# Patient Record
Sex: Male | Born: 1990
Health system: Southern US, Community
[De-identification: ages and names within clinical notes are randomized; demographics above are authoritative.]

---

## 2015-12-26 ENCOUNTER — Emergency Department (HOSPITAL_BASED_OUTPATIENT_CLINIC_OR_DEPARTMENT_OTHER)
Admission: EM | Admit: 2015-12-26 | Discharge: 2015-12-26 | Disposition: A | Discharge status: Home / Self Care | Payer: BLUE CROSS/BLUE SHIELD | Attending: Emergency Medicine | Admitting: Emergency Medicine

## 2015-12-26 ENCOUNTER — Encounter (HOSPITAL_BASED_OUTPATIENT_CLINIC_OR_DEPARTMENT_OTHER): Payer: Self-pay

## 2015-12-26 DIAGNOSIS — R51 Headache: Secondary | ICD-10-CM | POA: Insufficient documentation

## 2015-12-26 DIAGNOSIS — A084 Viral intestinal infection, unspecified: Secondary | ICD-10-CM | POA: Diagnosis not present

## 2015-12-26 DIAGNOSIS — M549 Dorsalgia, unspecified: Secondary | ICD-10-CM | POA: Insufficient documentation

## 2015-12-26 DIAGNOSIS — R112 Nausea with vomiting, unspecified: Secondary | ICD-10-CM | POA: Diagnosis present

## 2015-12-26 MED ORDER — SODIUM CHLORIDE 0.9 % IV BOLUS (SEPSIS)
1000.0000 mL | Freq: Once | INTRAVENOUS | Status: AC
  Administered 2015-12-26: 1000 mL via INTRAVENOUS

## 2015-12-26 MED ORDER — ONDANSETRON HCL 4 MG/2ML IJ SOLN
4.0000 mg | Freq: Once | INTRAMUSCULAR | Status: AC
  Administered 2015-12-26: 4 mg via INTRAVENOUS
  Filled 2015-12-26: qty 2

## 2015-12-26 MED ORDER — ONDANSETRON 8 MG PO TBDP: 8.0000 mg | ORAL_TABLET | Freq: Three times a day (TID) | ORAL | Status: DC | PRN

## 2015-12-26 MED ORDER — LOPERAMIDE HCL 2 MG PO CAPS
4.0000 mg | ORAL_CAPSULE | Freq: Once | ORAL | Status: AC
  Administered 2015-12-26: 4 mg via ORAL
  Filled 2015-12-26: qty 2

## 2015-12-26 MED FILL — ONDANSETRON ODT 8 MG TABLET: 8 | 4 days supply | Qty: 10 | Fill #0

## 2015-12-26 NOTE — Discharge Instructions (Signed)

## 2015-12-26 NOTE — ED Notes (Signed)
Pt c/o n/v/d, body aches since 0200 this morning.  He is able to hold down fluids for 30 minutes to an hour after vomiting.  No fevers, has not tried any OTC meds for symptoms.

## 2015-12-26 NOTE — ED Notes (Signed)
Pt tolerating PO fluids

## 2015-12-26 NOTE — ED Notes (Signed)
Pt c/o n/v/d and generalized body aches since 0200 this morning.  States he has had about 5 episodes of vomiting and has been able to hold down fluids in between episodes.

## 2015-12-26 NOTE — ED Provider Notes (Signed)
CSN: [id number]     Arrival date & time 12/26/15  [id number] History   First MD Initiated Contact with Patient 12/26/15 0622     Chief Complaint  Patient presents with  . Vomiting     (Consider location/radiation/quality/duration/timing/severity/associated sxs/prior Treatment) HPI  This is a 25 year old male with nausea, vomiting and diarrhea since about 2 this morning. He estimates he has vomited 6 times. His stools are described as watery. He is having some back pain and headache with this. He is also having some abdominal pain as a result of vomiting. Eyes a fever but complains of feeling cold. He has been able to keep some fluids down between episodes of vomiting.  History reviewed. No pertinent past medical history. History reviewed. No pertinent past surgical history. No family history on file. Social History  Substance Use Topics  . Smoking status: None  . Smokeless tobacco: None  . Alcohol Use: None    Review of Systems  All other systems reviewed and are negative.   Allergies  Review of patient's allergies indicates no known allergies.  Home Medications   Prior to Admission medications   Not on File   BP 109/68 mmHg  Pulse 80  Temp(Src) 98.3 F (36.8 C) (Oral)  Resp 18  Ht 5\' 10"  (1.778 m)  Wt 133 lb (60.328 kg)  BMI 19.08 kg/m2  SpO2 99%   Physical Exam  General: Well-developed, well-nourished male in no acute distress; appearance consistent with age of record HENT: normocephalic; atraumatic Eyes: pupils equal, round and reactive to light; extraocular muscles intact Neck: supple Heart: regular rate and rhythm; Lungs: clear to auscultation bilaterally Abdomen: soft; nondistended; epigastric tenderness; no masses or hepatosplenomegaly; bowel sounds present Extremities: No deformity; full range of motion; pulses normal Neurologic: Awake, alert and oriented; motor function intact in all extremities and symmetric; no facial droop Skin: Warm and dry Psychiatric:  Flat affect    ED Course  Procedures (including critical care time)   MDM  7:09 AM Patient has tolerated fluids without emesis after Zofran IV.      Paula LibraJohn Gearold Wainer, MD 12/26/15 [phone]

## 2016-02-26 ENCOUNTER — Encounter (HOSPITAL_COMMUNITY): Payer: Self-pay | Admitting: *Deleted

## 2016-02-26 ENCOUNTER — Emergency Department (HOSPITAL_COMMUNITY)
Admission: EM | Admit: 2016-02-26 | Discharge: 2016-02-27 | Disposition: A | Discharge status: Home / Self Care | Payer: BLUE CROSS/BLUE SHIELD | Attending: Emergency Medicine | Admitting: Emergency Medicine

## 2016-02-26 ENCOUNTER — Emergency Department (HOSPITAL_COMMUNITY): Payer: BLUE CROSS/BLUE SHIELD

## 2016-02-26 DIAGNOSIS — Y92310 Basketball court as the place of occurrence of the external cause: Secondary | ICD-10-CM | POA: Diagnosis not present

## 2016-02-26 DIAGNOSIS — Y9367 Activity, basketball: Secondary | ICD-10-CM | POA: Insufficient documentation

## 2016-02-26 DIAGNOSIS — S0081XA Abrasion of other part of head, initial encounter: Secondary | ICD-10-CM | POA: Diagnosis not present

## 2016-02-26 DIAGNOSIS — S060X0A Concussion without loss of consciousness, initial encounter: Secondary | ICD-10-CM | POA: Diagnosis not present

## 2016-02-26 DIAGNOSIS — S0990XA Unspecified injury of head, initial encounter: Secondary | ICD-10-CM | POA: Diagnosis present

## 2016-02-26 DIAGNOSIS — W500XXA Accidental hit or strike by another person, initial encounter: Secondary | ICD-10-CM | POA: Diagnosis not present

## 2016-02-26 DIAGNOSIS — Y998 Other external cause status: Secondary | ICD-10-CM | POA: Diagnosis not present

## 2016-02-26 LAB — ETHANOL: Alcohol, Ethyl (B): <5 mg/dL (ref <5)

## 2016-02-26 LAB — COMPREHENSIVE METABOLIC PANEL
ALBUMIN: 4.2 g/dL (ref 3.5–5.0)
ALT: 14 U/L — ABNORMAL LOW (ref 17–63)
ANION GAP: 4 — AB (ref 5–15)
AST: 20 U/L (ref 15–41)
Alkaline Phosphatase: 45 U/L (ref 38–126)
BUN: 11 mg/dL (ref 6–20)
CHLORIDE: 107 mmol/L (ref 101–111)
CO2: 27 mmol/L (ref 22–32)
Calcium: 9.1 mg/dL (ref 8.9–10.3)
Creatinine, Ser: 1.12 mg/dL (ref 0.61–1.24)
GFR calc non Af Amer: >60 mL/min (ref >60)
GLUCOSE: 84 mg/dL (ref 65–99)
Potassium: 4 mmol/L (ref 3.5–5.1)
SODIUM: 138 mmol/L (ref 135–145)
Total Bilirubin: 0.5 mg/dL (ref 0.3–1.2)
Total Protein: 6.6 g/dL (ref 6.5–8.1)

## 2016-02-26 LAB — I-STAT CHEM 8, ED
BUN: 13 mg/dL (ref 6–20)
CALCIUM ION: 1.17 mmol/L (ref 1.12–1.23)
CHLORIDE: 104 mmol/L (ref 101–111)
Creatinine, Ser: 1 mg/dL (ref 0.61–1.24)
GLUCOSE: 80 mg/dL (ref 65–99)
HCT: 41 % (ref 39.0–52.0)
HEMOGLOBIN: 13.9 g/dL (ref 13.0–17.0)
POTASSIUM: 4 mmol/L (ref 3.5–5.1)
SODIUM: 142 mmol/L (ref 135–145)
TCO2: 26 mmol/L (ref 0–100)

## 2016-02-26 LAB — URINALYSIS, ROUTINE W REFLEX MICROSCOPIC
BILIRUBIN URINE: NEGATIVE
GLUCOSE, UA: NEGATIVE mg/dL
Hgb urine dipstick: NEGATIVE
KETONES UR: NEGATIVE mg/dL
Nitrite: NEGATIVE
PH: 6.5 (ref 5.0–8.0)
Protein, ur: NEGATIVE mg/dL
Specific Gravity, Urine: 1.025 (ref 1.005–1.030)

## 2016-02-26 LAB — URINE MICROSCOPIC-ADD ON: RBC / HPF: NONE SEEN RBC/hpf (ref 0–5)

## 2016-02-26 LAB — PREPARE FRESH FROZEN PLASMA
UNIT DIVISION: 0
Unit division: 0

## 2016-02-26 LAB — CBC
HCT: 37.2 % — ABNORMAL LOW (ref 39.0–52.0)
Hemoglobin: 11.9 g/dL — ABNORMAL LOW (ref 13.0–17.0)
MCH: 25.8 pg — AB (ref 26.0–34.0)
MCHC: 32 g/dL (ref 30.0–36.0)
MCV: 80.7 fL (ref 78.0–100.0)
PLATELETS: 210 10*3/uL (ref 150–400)
RBC: 4.61 MIL/uL (ref 4.22–5.81)
RDW: 13.4 % (ref 11.5–15.5)
WBC: 6.7 10*3/uL (ref 4.0–10.5)

## 2016-02-26 LAB — CBG MONITORING, ED
Glucose-Capillary: 60 mg/dL — ABNORMAL LOW (ref 65–99)
Glucose-Capillary: 110 mg/dL — ABNORMAL HIGH (ref 65–99)

## 2016-02-26 LAB — ABO/RH: ABO/RH(D): O POS

## 2016-02-26 LAB — RAPID URINE DRUG SCREEN, HOSP PERFORMED
AMPHETAMINES: NOT DETECTED
BENZODIAZEPINES: NOT DETECTED
Barbiturates: NOT DETECTED
COCAINE: NOT DETECTED
OPIATES: NOT DETECTED
TETRAHYDROCANNABINOL: NOT DETECTED

## 2016-02-26 LAB — CDS SEROLOGY

## 2016-02-26 LAB — PROTIME-INR
INR: 1.12 (ref 0.00–1.49)
Prothrombin Time: 14.6 seconds (ref 11.6–15.2)

## 2016-02-26 LAB — I-STAT CG4 LACTIC ACID, ED: Lactic Acid, Venous: 1.01 mmol/L (ref 0.5–2.0)

## 2016-02-26 MED ORDER — DEXTROSE IN LACTATED RINGERS 5 % IV SOLN
INTRAVENOUS | Status: DC
  Administered 2016-02-26: 22:00:00 via INTRAVENOUS

## 2016-02-26 MED ORDER — DEXTROSE 5 % IV BOLUS: 500.0000 mL | Freq: Once | INTRAVENOUS | Status: DC

## 2016-02-26 MED ORDER — ACETAMINOPHEN 325 MG PO TABS
650.0000 mg | ORAL_TABLET | Freq: Once | ORAL | Status: AC
  Administered 2016-02-26: 650 mg via ORAL
  Filled 2016-02-26: qty 2

## 2016-02-26 NOTE — ED Notes (Signed)
Pt's CBG result was 60. Informed Britta MccreedyBarbara - RN.

## 2016-02-26 NOTE — ED Provider Notes (Signed)
CSN: [id number]     Arrival date & time 02/26/16  2146 History   First MD Initiated Contact with Patient 02/26/16 2153     Chief Complaint  Patient presents with  . Head Injury     (Consider location/radiation/quality/duration/timing/severity/associated sxs/prior Treatment) HPI Patient is a previously healthy 25 year old who presents as a level I trauma for head injury and decreased responsiveness. History is obtained from EMS as patient is unresponsive and not answering questions. Per report patient was playing in a basketball game earlier this evening when he head butted another player. He did not have any loss of consciousness or significant sequelae at the time. He sustained a small abrasion to his right forehead but was otherwise doing well. About an hour after returning home patient was lying on the couch and became unresponsive. Per EMS he would only move his eyes and does not talk or follow other commands. Vital signs were all stable and patient is protecting his airway prior to arrival. No known history of seizures. Family denied any ingestions. No history of head injury in the past. Family does report that patient has similar symptoms when he gets blood draws.  History reviewed. No pertinent past medical history. History reviewed. No pertinent past surgical history. No family history on file. Social History  Substance Use Topics  . Smoking status: Never Smoker   . Smokeless tobacco: Never Used  . Alcohol Use: No    Review of Systems  Unable to perform ROS: Mental status change      Allergies  Review of patient's allergies indicates not on file.  Home Medications   Prior to Admission medications   Not on File   BP 112/72 mmHg  Pulse 51  Temp(Src) 99.2 F (37.3 C) (Oral)  Resp 16  Ht 5\' 10"  (1.778 m)  Wt 65.772 kg  BMI 20.81 kg/m2  SpO2 98% Physical Exam  Constitutional: He appears well-developed and well-nourished.  HENT:  Head: Normocephalic.  Small  abrasion over right forehead with small underlying contusion.   Eyes: EOM are normal. Pupils are equal, round, and reactive to light.  Neck: Normal range of motion. Neck supple.  Cardiovascular: Normal rate, regular rhythm and intact distal pulses.   Pulmonary/Chest: Effort normal and breath sounds normal. No respiratory distress.  Abdominal: Soft. He exhibits no distension. There is no tenderness.  Musculoskeletal: Normal range of motion. He exhibits no edema or tenderness.  Neurological: He is alert.  PERRL, EOMI, but doesn't follow other commands. No response to nail bed pressure or sternal rub.  Skin: Skin is warm and dry. No rash noted.  Psychiatric: He has a normal mood and affect.  Nursing note and vitals reviewed.   ED Course  Procedures (including critical care time) Labs Review Labs Reviewed  CBC - Abnormal; Notable for the following:    Hemoglobin 11.9 (*)    HCT 37.2 (*)    MCH 25.8 (*)    All other components within normal limits  CBG MONITORING, ED - Abnormal; Notable for the following:    Glucose-Capillary 60 (*)    All other components within normal limits  CDS SEROLOGY  COMPREHENSIVE METABOLIC PANEL  ETHANOL  URINALYSIS, ROUTINE W REFLEX MICROSCOPIC (NOT AT [facility])  PROTIME-INR  URINE RAPID DRUG SCREEN, HOSP PERFORMED  I-STAT CHEM 8, ED  I-STAT CG4 LACTIC ACID, ED  TYPE AND SCREEN  PREPARE FRESH FROZEN PLASMA    Imaging Review Ct Head Wo Contrast  02/26/2016  CLINICAL DATA:  25 year old male with  level 1 trauma EXAM: CT HEAD WITHOUT CONTRAST CT CERVICAL SPINE WITHOUT CONTRAST TECHNIQUE: Multidetector CT imaging of the head and cervical spine was performed following the standard protocol without intravenous contrast. Multiplanar CT image reconstructions of the cervical spine were also generated. COMPARISON:  None. FINDINGS: CT HEAD FINDINGS The ventricles and the sulci are appropriate in size for the patient's age. There is no intracranial hemorrhage. No midline  shift or mass effect identified. The gray-white matter differentiation is preserved. The visualized paranasal sinuses and mastoid air cells are well aerated. The calvarium is intact. CT CERVICAL SPINE FINDINGS There is no acute fracture or subluxation of the cervical spine. There is straightening of normal cervical lordosis which may be positional or due to muscle spasm. The intervertebral disc spaces are preserved.The odontoid and spinous processes are intact.There is normal anatomic alignment of the C1-C2 lateral masses. The visualized soft tissues appear unremarkable. IMPRESSION: No acute intracranial pathology. No acute/ traumatic cervical spine pathology. The above findings were reviewed with Dr. Donell Beers in person at the time of the study on 02/26/2016 at 10:15 pm. Electronically Signed   By: Elgie Collard M.D.   On: 02/26/2016 22:17   Ct Cervical Spine Wo Contrast  02/26/2016  CLINICAL DATA:  25 year old male with level 1 trauma EXAM: CT HEAD WITHOUT CONTRAST CT CERVICAL SPINE WITHOUT CONTRAST TECHNIQUE: Multidetector CT imaging of the head and cervical spine was performed following the standard protocol without intravenous contrast. Multiplanar CT image reconstructions of the cervical spine were also generated. COMPARISON:  None. FINDINGS: CT HEAD FINDINGS The ventricles and the sulci are appropriate in size for the patient's age. There is no intracranial hemorrhage. No midline shift or mass effect identified. The gray-white matter differentiation is preserved. The visualized paranasal sinuses and mastoid air cells are well aerated. The calvarium is intact. CT CERVICAL SPINE FINDINGS There is no acute fracture or subluxation of the cervical spine. There is straightening of normal cervical lordosis which may be positional or due to muscle spasm. The intervertebral disc spaces are preserved.The odontoid and spinous processes are intact.There is normal anatomic alignment of the C1-C2 lateral masses. The  visualized soft tissues appear unremarkable. IMPRESSION: No acute intracranial pathology. No acute/ traumatic cervical spine pathology. The above findings were reviewed with Dr. Donell Beers in person at the time of the study on 02/26/2016 at 10:15 pm. Electronically Signed   By: Elgie Collard M.D.   On: 02/26/2016 22:17   Dg Chest Port 1 View  02/26/2016  CLINICAL DATA:  Bumped heads while playing basketball 2-3 hours ago, now unresponsive, level 1 trauma EXAM: PORTABLE CHEST 1 VIEW COMPARISON:  Portable exam 2150 hours without priors for comparison. FINDINGS: Normal heart size, mediastinal contours, and pulmonary vascularity. Lungs clear. No pleural effusion or pneumothorax. Bones unremarkable. IMPRESSION: No acute abnormalities. Electronically Signed   By: Ulyses Southward M.D.   On: 02/26/2016 22:35   I have personally reviewed and evaluated these images and lab results as part of my medical decision-making.   EKG Interpretation None      MDM   Final diagnoses:  Concussion, without loss of consciousness, initial encounter    Patient is a 25 year old who presents as a level I trauma after sustaining a head injury while playing basketball. On presentation ABC's are intact. Patient is maintaining his airway. He is not verbally responsive to questions but does track with his eyes. He does not otherwise follow commands even to very painful stimuli. He has a small abrasion with contusion  on his right forehead. No other signs of injuries. Chest x-ray does not reveal any acute somatic injuries. CT head and cervical spine are negative for injury. Initial CBG was 60 and patient was started on D5 LR per trauma surgery request. Upon in out catheterization patient became more alert and began to talk and follow commands. Cervical collar was cleared. Patient complains of mild headache and he is given Tylenol. Abrasion was cleaned and covered with bacitracin. No repairs required. Labs and urine studies are  unremarkable. UDS negative. Patient was observed in the ED without recurrence of symptoms. He is tolerating by mouth and ambulating without difficulty. Repeat CBG WNL. Discussed lab and imaging findings with patient and family feel comfortable being discharged at this time. Concussion precautions given. Patient and father are in agreement with plan.  Patient was seen and discussed with Dr. Anitra LauthPlunkett, ED attending    Isa RankinAnn B Jazyiah Yiu, MD 02/27/16 [id number]  Gwyneth SproutWhitney Plunkett, MD 02/27/16 2310

## 2016-02-26 NOTE — Progress Notes (Signed)
Chaplain responded to level 1 trauma page for pt with head injury. Chaplain provided emotional support for pt's dad who had travelled with pt in EMS vehicle. Chaplain took pt's dad to consult room and provided ministry of presence for him while we awaited arrival of other family members.Dad said pt had butted heads with someone while playing basketball this evening. Pt's mom, sister, and grandmother arrived while pt was in [place]. I provided ginger ale for them and assured them (per Dr.) that they could see pt when he returned from [place]. Since I had to go to another case, I asked RN Britta MccreedyBarbara to bring family from consult room to see pt when the time was right, and she did. When I returned to ED I had another conversation with pt's dad in consult B, and we rejoiced that pt was now talking. Had similar conversation with pt's mom at bedside in D36 and offered encouragement to pt also.

## 2016-02-26 NOTE — ED Notes (Signed)
CBG 110. 

## 2016-02-26 NOTE — H&P (Deleted)
History   Christoffer Currier is an 25 y.o. male.   Chief Complaint:  Chief Complaint  Patient presents with  . Head Injury    Head Injury Location:  Frontal Mechanism of injury: direct blow   Associated symptoms: disorientation and loss of consciousness   Risk factors: no alcohol use    Pt is a 25 yo M who was playing basketball earlier today and had a head on head collision.  He went home and was doing OK.  Then he laid on the couch.  After an hour, his family was unable to rouse him.  They called EMS.  He has no history of drug use or excessive alcohol use.    History reviewed. No pertinent past medical history. Family denies past medical history, meds, allergies, alcohol.    History reviewed. No pertinent past surgical history.  No family history on file. Social History:  reports that he has never smoked. He has never used smokeless tobacco. He reports that he does not drink alcohol or use illicit drugs.  Allergies  No Known Allergies  Home Medications   (Not in a hospital admission)  Trauma Course   Results for orders placed or performed during the hospital encounter of 02/26/16 (from the past 48 hour(s))  Prepare fresh frozen plasma     Status: None   Collection Time: 02/26/16  9:49 PM  Result Value Ref Range   Unit Number [id number]    Blood Component Type LIQ PLASMA    Unit division 00    Status of Unit REL FROM [facility]    Unit tag comment VERBAL ORDERS PER DR PLUNKETT    Transfusion Status OK TO TRANSFUSE    Unit Number [id number]    Blood Component Type LIQ PLASMA    Unit division 00    Status of Unit REL FROM [facility]    Unit tag comment VERBAL ORDERS PER DR PLUNKETT    Transfusion Status OK TO TRANSFUSE   CDS serology     Status: None   Collection Time: 02/26/16  9:50 PM  Result Value Ref Range   CDS serology specimen      SPECIMEN WILL BE HELD FOR 14 DAYS IF TESTING IS REQUIRED  Comprehensive metabolic panel     Status: Abnormal   Collection Time:  02/26/16  9:50 PM  Result Value Ref Range   Sodium 138 135 - 145 mmol/L   Potassium 4.0 3.5 - 5.1 mmol/L   Chloride 107 101 - 111 mmol/L   CO2 27 22 - 32 mmol/L   Glucose, Bld 84 65 - 99 mg/dL   BUN 11 6 - 20 mg/dL   Creatinine, Ser 1.12 0.61 - 1.24 mg/dL   Calcium 9.1 8.9 - 10.3 mg/dL   Total Protein 6.6 6.5 - 8.1 g/dL   Albumin 4.2 3.5 - 5.0 g/dL   AST 20 15 - 41 U/L   ALT 14 (L) 17 - 63 U/L   Alkaline Phosphatase 45 38 - 126 U/L   Total Bilirubin 0.5 0.3 - 1.2 mg/dL   GFR calc non Af Amer >60 >60 mL/min   GFR calc Af Amer >60 >60 mL/min    Comment: (NOTE) The eGFR has been calculated using the CKD EPI equation. This calculation has not been validated in all clinical situations. eGFR's persistently <60 mL/min signify possible Chronic Kidney Disease.    Anion gap 4 (L) 5 - 15  CBC     Status: Abnormal   Collection Time: 02/26/16  9:50  PM  Result Value Ref Range   WBC 6.7 4.0 - 10.5 K/uL   RBC 4.61 4.22 - 5.81 MIL/uL   Hemoglobin 11.9 (L) 13.0 - 17.0 g/dL   HCT 37.2 (L) 39.0 - 52.0 %   MCV 80.7 78.0 - 100.0 fL   MCH 25.8 (L) 26.0 - 34.0 pg   MCHC 32.0 30.0 - 36.0 g/dL   RDW 13.4 11.5 - 15.5 %   Platelets 210 150 - 400 K/uL  Ethanol     Status: None   Collection Time: 02/26/16  9:50 PM  Result Value Ref Range   Alcohol, Ethyl (B) <5 <5 mg/dL    Comment:        LOWEST DETECTABLE LIMIT FOR SERUM ALCOHOL IS 5 mg/dL FOR MEDICAL PURPOSES ONLY   Protime-INR     Status: None   Collection Time: 02/26/16  9:50 PM  Result Value Ref Range   Prothrombin Time 14.6 11.6 - 15.2 seconds   INR 1.12 0.00 - 1.49  Type and screen     Status: None   Collection Time: 02/26/16  9:50 PM  Result Value Ref Range   ABO/RH(D) O POS    Antibody Screen NEG    Sample Expiration 02/29/2016    Unit Number [id number]    Blood Component Type RED CELLS,LR    Unit division 00    Status of Unit REL FROM [facility]    Unit tag comment VERBAL ORDERS PER DR PLUNKETT    Transfusion Status OK TO  TRANSFUSE    Crossmatch Result NOT NEEDED    Unit Number [id number]    Blood Component Type RED CELLS,LR    Unit division 00    Status of Unit REL FROM [facility]    Unit tag comment VERBAL ORDERS PER DR PLUNKETT    Transfusion Status OK TO TRANSFUSE    Crossmatch Result NOT NEEDED   ABO/Rh     Status: None   Collection Time: 02/26/16  9:50 PM  Result Value Ref Range   ABO/RH(D) O POS   CBG monitoring, ED     Status: Abnormal   Collection Time: 02/26/16  9:52 PM  Result Value Ref Range   Glucose-Capillary 60 (L) 65 - 99 mg/dL   Comment 1 Notify RN    Comment 2 Document in Chart   I-Stat Chem 8, ED     Status: None   Collection Time: 02/26/16 10:05 PM  Result Value Ref Range   Sodium 142 135 - 145 mmol/L   Potassium 4.0 3.5 - 5.1 mmol/L   Chloride 104 101 - 111 mmol/L   BUN 13 6 - 20 mg/dL   Creatinine, Ser 1.00 0.61 - 1.24 mg/dL   Glucose, Bld 80 65 - 99 mg/dL   Calcium, Ion 1.17 1.12 - 1.23 mmol/L   TCO2 26 0 - 100 mmol/L   Hemoglobin 13.9 13.0 - 17.0 g/dL   HCT 41.0 39.0 - 52.0 %  I-Stat CG4 Lactic Acid, ED     Status: None   Collection Time: 02/26/16 10:06 PM  Result Value Ref Range   Lactic Acid, Venous 1.01 0.5 - 2.0 mmol/L  Urinalysis, Routine w reflex microscopic     Status: Abnormal   Collection Time: 02/26/16 10:27 PM  Result Value Ref Range   Color, Urine YELLOW YELLOW   APPearance CLEAR CLEAR   Specific Gravity, Urine 1.025 1.005 - 1.030   pH 6.5 5.0 - 8.0   Glucose, UA NEGATIVE NEGATIVE mg/dL   Hgb urine  dipstick NEGATIVE NEGATIVE   Bilirubin Urine NEGATIVE NEGATIVE   Ketones, ur NEGATIVE NEGATIVE mg/dL   Protein, ur NEGATIVE NEGATIVE mg/dL   Nitrite NEGATIVE NEGATIVE   Leukocytes, UA SMALL (A) NEGATIVE  Urine rapid drug screen (hosp performed)     Status: None   Collection Time: 02/26/16 10:27 PM  Result Value Ref Range   Opiates NONE DETECTED NONE DETECTED   Cocaine NONE DETECTED NONE DETECTED   Benzodiazepines NONE DETECTED NONE DETECTED    Amphetamines NONE DETECTED NONE DETECTED   Tetrahydrocannabinol NONE DETECTED NONE DETECTED   Barbiturates NONE DETECTED NONE DETECTED    Comment:        DRUG SCREEN FOR MEDICAL PURPOSES ONLY.  IF CONFIRMATION IS NEEDED FOR ANY PURPOSE, NOTIFY LAB WITHIN 5 DAYS.        LOWEST DETECTABLE LIMITS FOR URINE DRUG SCREEN Drug Class       Cutoff (ng/mL) Amphetamine      1000 Barbiturate      200 Benzodiazepine   629 Tricyclics       476 Opiates          300 Cocaine          300 THC              50   Urine microscopic-add on     Status: Abnormal   Collection Time: 02/26/16 10:27 PM  Result Value Ref Range   Squamous Epithelial / LPF 0-5 (A) NONE SEEN   WBC, UA 6-30 0 - 5 WBC/hpf   RBC / HPF NONE SEEN 0 - 5 RBC/hpf   Bacteria, UA RARE (A) NONE SEEN  CBG monitoring, ED     Status: Abnormal   Collection Time: 02/26/16 11:15 PM  Result Value Ref Range   Glucose-Capillary 110 (H) 65 - 99 mg/dL   Ct Head Wo Contrast  02/26/2016  CLINICAL DATA:  25 year old male with level 1 trauma EXAM: CT HEAD WITHOUT CONTRAST CT CERVICAL SPINE WITHOUT CONTRAST TECHNIQUE: Multidetector CT imaging of the head and cervical spine was performed following the standard protocol without intravenous contrast. Multiplanar CT image reconstructions of the cervical spine were also generated. COMPARISON:  None. FINDINGS: CT HEAD FINDINGS The ventricles and the sulci are appropriate in size for the patient's age. There is no intracranial hemorrhage. No midline shift or mass effect identified. The gray-white matter differentiation is preserved. The visualized paranasal sinuses and mastoid air cells are well aerated. The calvarium is intact. CT CERVICAL SPINE FINDINGS There is no acute fracture or subluxation of the cervical spine. There is straightening of normal cervical lordosis which may be positional or due to muscle spasm. The intervertebral disc spaces are preserved.The odontoid and spinous processes are intact.There is  normal anatomic alignment of the C1-C2 lateral masses. The visualized soft tissues appear unremarkable. IMPRESSION: No acute intracranial pathology. No acute/ traumatic cervical spine pathology. The above findings were reviewed with Dr. Barry Dienes in person at the time of the study on 02/26/2016 at 10:15 pm. Electronically Signed   By: Anner Crete M.D.   On: 02/26/2016 22:17   Ct Cervical Spine Wo Contrast  02/26/2016  CLINICAL DATA:  25 year old male with level 1 trauma EXAM: CT HEAD WITHOUT CONTRAST CT CERVICAL SPINE WITHOUT CONTRAST TECHNIQUE: Multidetector CT imaging of the head and cervical spine was performed following the standard protocol without intravenous contrast. Multiplanar CT image reconstructions of the cervical spine were also generated. COMPARISON:  None. FINDINGS: CT HEAD FINDINGS The ventricles and the sulci are  appropriate in size for the patient's age. There is no intracranial hemorrhage. No midline shift or mass effect identified. The gray-white matter differentiation is preserved. The visualized paranasal sinuses and mastoid air cells are well aerated. The calvarium is intact. CT CERVICAL SPINE FINDINGS There is no acute fracture or subluxation of the cervical spine. There is straightening of normal cervical lordosis which may be positional or due to muscle spasm. The intervertebral disc spaces are preserved.The odontoid and spinous processes are intact.There is normal anatomic alignment of the C1-C2 lateral masses. The visualized soft tissues appear unremarkable. IMPRESSION: No acute intracranial pathology. No acute/ traumatic cervical spine pathology. The above findings were reviewed with Dr. Barry Dienes in person at the time of the study on 02/26/2016 at 10:15 pm. Electronically Signed   By: Anner Crete M.D.   On: 02/26/2016 22:17   Dg Chest Port 1 View  02/26/2016  CLINICAL DATA:  Bumped heads while playing basketball 2-3 hours ago, now unresponsive, level 1 trauma EXAM: PORTABLE  CHEST 1 VIEW COMPARISON:  Portable exam 2150 hours without priors for comparison. FINDINGS: Normal heart size, mediastinal contours, and pulmonary vascularity. Lungs clear. No pleural effusion or pneumothorax. Bones unremarkable. IMPRESSION: No acute abnormalities. Electronically Signed   By: Lavonia Dana M.D.   On: 02/26/2016 22:35    Review of Systems  Unable to perform ROS: medical condition  Neurological: Positive for loss of consciousness.    Blood pressure 114/75, pulse 50, temperature 99.2 F (37.3 C), temperature source Oral, resp. rate 14, height 5' 10"  (1.778 m), weight 65.772 kg (145 lb), SpO2 98 %. Physical Exam  Constitutional: He appears well-developed and well-nourished. He appears lethargic. No distress. Cervical collar in place.  HENT:  Head: Normocephalic. Head is with abrasion.    Right frontal abrasion  Eyes: Conjunctivae are normal. Pupils are equal, round, and reactive to light. Right eye exhibits no discharge. Left eye exhibits no discharge. No scleral icterus.  Gaze favors left  Neck: Neck supple. No JVD present. No tracheal deviation present. No thyromegaly present.  Cardiovascular: Normal rate, regular rhythm, normal heart sounds and intact distal pulses.  Exam reveals no gallop and no friction rub.   No murmur heard. Respiratory: Effort normal and breath sounds normal. No respiratory distress. He has no wheezes. He has no rales. He exhibits no tenderness.  GI: Soft. He exhibits no distension and no mass. There is no tenderness. There is no rebound and no guarding.  Genitourinary: Rectum normal, prostate normal and penis normal.  Musculoskeletal: Normal range of motion. He exhibits no edema or tenderness.  Lymphadenopathy:    He has no cervical adenopathy.  Neurological: He appears lethargic. He exhibits abnormal muscle tone. Coordination abnormal. GCS eye subscore is 4. GCS verbal subscore is 1. GCS motor subscore is 1.  Skin: Skin is warm and dry. No rash  noted. He is not diaphoretic. No erythema. No pallor.  Psychiatric:  Eyes open, no interaction.     Assessment/Plan Concussion Hypoglycemia  Given D5LR Allowed to wake up and was ambulatory without dizziness and able to respond appropriately.   No evidence of substance abuse.    Eusevio Schriver 02/28/2016, 9:16 AM   Procedures

## 2016-02-26 NOTE — ED Notes (Signed)
Ambulated around the nurses station without difficulty Patient stated he was fine

## 2016-02-26 NOTE — ED Notes (Signed)
Patient presents via EMS.  Patient was playing basketball and a head butt situation occurred.  Patient has small laceration and hematoma to the right side of his forehead.  Per EMS family stated he came home approximately 1900 and was walking and talking, laid down on the couch approx 1 hour after returning home and when they tried to awaken him he would not arouse.  No seizure activity noted by family.  EMS applied c collar and transported patient.  Reported GCS 5

## 2016-02-26 NOTE — ED Notes (Signed)
Per Dr Donell BeersByerly - no resp injuries, no ext injuries, +rectal tone

## 2016-02-26 NOTE — ED Notes (Signed)
Water given to patient to drink.  Tolerated well

## 2016-02-26 NOTE — ED Notes (Signed)
Patient with eyes open talking - still confused at times but reoriented easily.  Stated he wanted to get up.  Instructed we needed to wait until all tests have been completed.

## 2016-02-27 ENCOUNTER — Encounter (HOSPITAL_BASED_OUTPATIENT_CLINIC_OR_DEPARTMENT_OTHER): Payer: Self-pay

## 2016-02-27 LAB — TYPE AND SCREEN
ABO/RH(D): O POS
Antibody Screen: NEGATIVE
UNIT DIVISION: 0
Unit division: 0

## 2016-02-27 NOTE — ED Notes (Signed)
Patient tolerating being up, no c/o dizziness.  Up to wheelchair and taken out to be discharged  Father and Mother will observe patient at home

## 2016-02-27 NOTE — Discharge Instructions (Signed)
Concussion, Adult  A concussion, or closed-head injury, is a brain injury caused by a direct blow to the head or by a quick and sudden movement (jolt) of the head or neck. Concussions are usually not life-threatening. Even so, the effects of a concussion can be serious. If you have had a concussion before, you are more likely to experience concussion-like symptoms after a direct blow to the head.   CAUSES  · Direct blow to the head, such as from running into another player during a soccer game, being hit in a fight, or hitting your head on a hard surface.  · A jolt of the head or neck that causes the brain to move back and forth inside the skull, such as in a car crash.  SIGNS AND SYMPTOMS  The signs of a concussion can be hard to notice. Early on, they may be missed by you, family members, and health care providers. You may look fine but act or feel differently.  Symptoms are usually temporary, but they may last for days, weeks, or even longer. Some symptoms may appear right away while others may not show up for hours or days. Every head injury is different. Symptoms include:  · Mild to moderate headaches that will not go away.  · A feeling of pressure inside your head.  · Having more trouble than usual:    Learning or remembering things you have heard.    Answering questions.    Paying attention or concentrating.    Organizing daily tasks.    Making decisions and solving problems.  · Slowness in thinking, acting or reacting, speaking, or reading.  · Getting lost or being easily confused.  · Feeling tired all the time or lacking energy (fatigued).  · Feeling drowsy.  · Sleep disturbances.    Sleeping more than usual.    Sleeping less than usual.    Trouble falling asleep.    Trouble sleeping (insomnia).  · Loss of balance or feeling lightheaded or dizzy.  · Nausea or vomiting.  · Numbness or tingling.  · Increased sensitivity to:    Sounds.    Lights.    Distractions.  · Vision problems or eyes that tire  easily.  · Diminished sense of taste or smell.  · Ringing in the ears.  · Mood changes such as feeling sad or anxious.  · Becoming easily irritated or angry for little or no reason.  · Lack of motivation.  · Seeing or hearing things other people do not see or hear (hallucinations).  DIAGNOSIS  Your health care provider can usually diagnose a concussion based on a description of your injury and symptoms. He or she will ask whether you passed out (lost consciousness) and whether you are having trouble remembering events that happened right before and during your injury.  Your evaluation might include:  · A brain scan to look for signs of injury to the brain. Even if the test shows no injury, you may still have a concussion.  · Blood tests to be sure other problems are not present.  TREATMENT  · Concussions are usually treated in an emergency department, in urgent care, or at a clinic. You may need to stay in the hospital overnight for further treatment.  · Tell your health care provider if you are taking any medicines, including prescription medicines, over-the-counter medicines, and natural remedies. Some medicines, such as blood thinners (anticoagulants) and aspirin, may increase the chance of complications. Also tell your health care   provider whether you have had alcohol or are taking illegal drugs. This information may affect treatment.  · Your health care provider will send you home with important instructions to follow.  · How fast you will recover from a concussion depends on many factors. These factors include how severe your concussion is, what part of your brain was injured, your age, and how healthy you were before the concussion.  · Most people with mild injuries recover fully. Recovery can take time. In general, recovery is slower in older persons. Also, persons who have had a concussion in the past or have other medical problems may find that it takes longer to recover from their current injury.  HOME  CARE INSTRUCTIONS  General Instructions  · Carefully follow the directions your health care provider gave you.  · Only take over-the-counter or prescription medicines for pain, discomfort, or fever as directed by your health care provider.  · Take only those medicines that your health care provider has approved.  · Do not drink alcohol until your health care provider says you are well enough to do so. Alcohol and certain other drugs may slow your recovery and can put you at risk of further injury.  · If it is harder than usual to remember things, write them down.  · If you are easily distracted, try to do one thing at a time. For example, do not try to watch TV while fixing dinner.  · Talk with family members or close friends when making important decisions.  · Keep all follow-up appointments. Repeated evaluation of your symptoms is recommended for your recovery.  · Watch your symptoms and tell others to do the same. Complications sometimes occur after a concussion. Older adults with a brain injury may have a higher risk of serious complications, such as a blood clot on the brain.  · Tell your teachers, school nurse, school counselor, coach, athletic trainer, or work manager about your injury, symptoms, and restrictions. Tell them about what you can or cannot do. They should watch for:    Increased problems with attention or concentration.    Increased difficulty remembering or learning new information.    Increased time needed to complete tasks or assignments.    Increased irritability or decreased ability to cope with stress.    Increased symptoms.  · Rest. Rest helps the brain to heal. Make sure you:    Get plenty of sleep at night. Avoid staying up late at night.    Keep the same bedtime hours on weekends and weekdays.    Rest during the day. Take daytime naps or rest breaks when you feel tired.  · Limit activities that require a lot of thought or concentration. These include:    Doing homework or job-related  work.    Watching TV.    Working on the computer.  · Avoid any situation where there is potential for another head injury (football, hockey, soccer, basketball, martial arts, downhill snow sports and horseback riding). Your condition will get worse every time you experience a concussion. You should avoid these activities until you are evaluated by the appropriate follow-up health care providers.  Returning To Your Regular Activities  You will need to return to your normal activities slowly, not all at once. You must give your body and brain enough time for recovery.  · Do not return to sports or other athletic activities until your health care provider tells you it is safe to do so.  · Ask   your health care provider when you can drive, ride a bicycle, or operate heavy machinery. Your ability to react may be slower after a brain injury. Never do these activities if you are dizzy.  · Ask your health care provider about when you can return to work or school.  Preventing Another Concussion  It is very important to avoid another brain injury, especially before you have recovered. In rare cases, another injury can lead to permanent brain damage, brain swelling, or death. The risk of this is greatest during the first 7-10 days after a head injury. Avoid injuries by:  · Wearing a seat belt when riding in a car.  · Drinking alcohol only in moderation.  · Wearing a helmet when biking, skiing, skateboarding, skating, or doing similar activities.  · Avoiding activities that could lead to a second concussion, such as contact or recreational sports, until your health care provider says it is okay.  · Taking safety measures in your home.    Remove clutter and tripping hazards from floors and stairways.    Use grab bars in bathrooms and handrails by stairs.    Place non-slip mats on floors and in bathtubs.    Improve lighting in dim areas.  SEEK MEDICAL CARE IF:  · You have increased problems paying attention or  concentrating.  · You have increased difficulty remembering or learning new information.  · You need more time to complete tasks or assignments than before.  · You have increased irritability or decreased ability to cope with stress.  · You have more symptoms than before.  Seek medical care if you have any of the following symptoms for more than 2 weeks after your injury:  · Lasting (chronic) headaches.  · Dizziness or balance problems.  · Nausea.  · Vision problems.  · Increased sensitivity to noise or light.  · Depression or mood swings.  · Anxiety or irritability.  · Memory problems.  · Difficulty concentrating or paying attention.  · Sleep problems.  · Feeling tired all the time.  SEEK IMMEDIATE MEDICAL CARE IF:  · You have severe or worsening headaches. These may be a sign of a blood clot in the brain.  · You have weakness (even if only in one hand, leg, or part of the face).  · You have numbness.  · You have decreased coordination.  · You vomit repeatedly.  · You have increased sleepiness.  · One pupil is larger than the other.  · You have convulsions.  · You have slurred speech.  · You have increased confusion. This may be a sign of a blood clot in the brain.  · You have increased restlessness, agitation, or irritability.  · You are unable to recognize people or places.  · You have neck pain.  · It is difficult to wake you up.  · You have unusual behavior changes.  · You lose consciousness.  MAKE SURE YOU:  · Understand these instructions.  · Will watch your condition.  · Will get help right away if you are not doing well or get worse.     This information is not intended to replace advice given to you by your health care provider. Make sure you discuss any questions you have with your health care provider.     Document Released: 12/21/2003 Document Revised: 10/21/2014 Document Reviewed: 04/22/2013  [employer]vier Interactive Patient Education ©2016 [employer]

## 2016-02-27 NOTE — ED Notes (Signed)
Discharge instructions and follow up reviewed - voiced understanding by father and patient

## 2016-02-28 NOTE — Consult Note (Signed)
History   Thomas Padilla is an 25 y.o. male.  Chief Complaint:  Chief Complaint  Patient presents with  . Head Injury    Head Injury Location: Frontal Mechanism of injury: direct blow  Associated symptoms: disorientation and loss of consciousness  Risk factors: no alcohol use   Pt is a 25 yo M who was playing basketball earlier today and had a head on head collision. He went home and was doing OK. Then he laid on the couch. After an hour, his family was unable to rouse him. They called EMS. He has no history of drug use or excessive alcohol use.   History reviewed. No pertinent past medical history. Family denies past medical history, meds, allergies, alcohol.   History reviewed. No pertinent past surgical history.  No family history on file. Social History:  reports that he has never smoked. He has never used smokeless tobacco. He reports that he does not drink alcohol or use illicit drugs.  Allergies  No Known Allergies  Home Medications   (Not in a hospital admission)  Trauma Course    Lab Results Last 48 Hours    Results for orders placed or performed during the hospital encounter of 02/26/16 (from the past 48 hour(s))  Prepare fresh frozen plasma Status: None   Collection Time: 02/26/16 9:49 PM  Result Value Ref Range   Unit Number [id number]    Blood Component Type LIQ PLASMA    Unit division 00    Status of Unit REL FROM [facility]    Unit tag comment VERBAL ORDERS PER DR PLUNKETT    Transfusion Status OK TO TRANSFUSE    Unit Number [id number]    Blood Component Type LIQ PLASMA    Unit division 00    Status of Unit REL FROM [facility]    Unit tag comment VERBAL ORDERS PER DR PLUNKETT    Transfusion Status OK TO TRANSFUSE   CDS serology Status: None   Collection Time: 02/26/16 9:50 PM  Result Value Ref Range   CDS serology specimen      SPECIMEN WILL  BE HELD FOR 14 DAYS IF TESTING IS REQUIRED  Comprehensive metabolic panel Status: Abnormal   Collection Time: 02/26/16 9:50 PM  Result Value Ref Range   Sodium 138 135 - 145 mmol/L   Potassium 4.0 3.5 - 5.1 mmol/L   Chloride 107 101 - 111 mmol/L   CO2 27 22 - 32 mmol/L   Glucose, Bld 84 65 - 99 mg/dL   BUN 11 6 - 20 mg/dL   Creatinine, Ser 1.12 0.61 - 1.24 mg/dL   Calcium 9.1 8.9 - 10.3 mg/dL   Total Protein 6.6 6.5 - 8.1 g/dL   Albumin 4.2 3.5 - 5.0 g/dL   AST 20 15 - 41 U/L   ALT 14 (L) 17 - 63 U/L   Alkaline Phosphatase 45 38 - 126 U/L   Total Bilirubin 0.5 0.3 - 1.2 mg/dL   GFR calc non Af Amer >60 >60 mL/min   GFR calc Af Amer >60 >60 mL/min    Comment: (NOTE) The eGFR has been calculated using the CKD EPI equation. This calculation has not been validated in all clinical situations. eGFR's persistently <60 mL/min signify possible Chronic Kidney Disease.    Anion gap 4 (L) 5 - 15  CBC Status: Abnormal   Collection Time: 02/26/16 9:50 PM  Result Value Ref Range   WBC 6.7 4.0 - 10.5 K/uL   RBC 4.61 4.22 - 5.81 MIL/uL  Hemoglobin 11.9 (L) 13.0 - 17.0 g/dL   HCT 37.2 (L) 39.0 - 52.0 %   MCV 80.7 78.0 - 100.0 fL   MCH 25.8 (L) 26.0 - 34.0 pg   MCHC 32.0 30.0 - 36.0 g/dL   RDW 13.4 11.5 - 15.5 %   Platelets 210 150 - 400 K/uL  Ethanol Status: None   Collection Time: 02/26/16 9:50 PM  Result Value Ref Range   Alcohol, Ethyl (B) <5 <5 mg/dL    Comment:   LOWEST DETECTABLE LIMIT FOR SERUM ALCOHOL IS 5 mg/dL FOR MEDICAL PURPOSES ONLY   Protime-INR Status: None   Collection Time: 02/26/16 9:50 PM  Result Value Ref Range   Prothrombin Time 14.6 11.6 - 15.2 seconds   INR 1.12 0.00 - 1.49  Type and screen Status: None   Collection Time: 02/26/16 9:50 PM  Result Value Ref Range    ABO/RH(D) O POS    Antibody Screen NEG    Sample Expiration 02/29/2016    Unit Number [id number]    Blood Component Type RED CELLS,LR    Unit division 00    Status of Unit REL FROM [facility]    Unit tag comment VERBAL ORDERS PER DR PLUNKETT    Transfusion Status OK TO TRANSFUSE    Crossmatch Result NOT NEEDED    Unit Number [id number]    Blood Component Type RED CELLS,LR    Unit division 00    Status of Unit REL FROM [facility]    Unit tag comment VERBAL ORDERS PER DR PLUNKETT    Transfusion Status OK TO TRANSFUSE    Crossmatch Result NOT NEEDED   ABO/Rh Status: None   Collection Time: 02/26/16 9:50 PM  Result Value Ref Range   ABO/RH(D) O POS   CBG monitoring, ED Status: Abnormal   Collection Time: 02/26/16 9:52 PM  Result Value Ref Range   Glucose-Capillary 60 (L) 65 - 99 mg/dL   Comment 1 Notify RN    Comment 2 Document in Chart   I-Stat Chem 8, ED Status: None   Collection Time: 02/26/16 10:05 PM  Result Value Ref Range   Sodium 142 135 - 145 mmol/L   Potassium 4.0 3.5 - 5.1 mmol/L   Chloride 104 101 - 111 mmol/L   BUN 13 6 - 20 mg/dL   Creatinine, Ser 1.00 0.61 - 1.24 mg/dL   Glucose, Bld 80 65 - 99 mg/dL   Calcium, Ion 1.17 1.12 - 1.23 mmol/L   TCO2 26 0 - 100 mmol/L   Hemoglobin 13.9 13.0 - 17.0 g/dL   HCT 41.0 39.0 - 52.0 %  I-Stat CG4 Lactic Acid, ED Status: None   Collection Time: 02/26/16 10:06 PM  Result Value Ref Range   Lactic Acid, Venous 1.01 0.5 - 2.0 mmol/L  Urinalysis, Routine w reflex microscopic Status: Abnormal   Collection Time: 02/26/16 10:27 PM  Result Value Ref Range   Color, Urine YELLOW YELLOW   APPearance CLEAR CLEAR   Specific Gravity, Urine 1.025 1.005 - 1.030   pH 6.5 5.0 - 8.0   Glucose, UA NEGATIVE NEGATIVE mg/dL   Hgb urine dipstick  NEGATIVE NEGATIVE   Bilirubin Urine NEGATIVE NEGATIVE   Ketones, ur NEGATIVE NEGATIVE mg/dL   Protein, ur NEGATIVE NEGATIVE mg/dL   Nitrite NEGATIVE NEGATIVE   Leukocytes, UA SMALL (A) NEGATIVE  Urine rapid drug screen (hosp performed) Status: None   Collection Time: 02/26/16 10:27 PM  Result Value Ref Range   Opiates NONE DETECTED NONE DETECTED  Cocaine NONE DETECTED NONE DETECTED   Benzodiazepines NONE DETECTED NONE DETECTED   Amphetamines NONE DETECTED NONE DETECTED   Tetrahydrocannabinol NONE DETECTED NONE DETECTED   Barbiturates NONE DETECTED NONE DETECTED    Comment:   DRUG SCREEN FOR MEDICAL PURPOSES ONLY. IF CONFIRMATION IS NEEDED FOR ANY PURPOSE, NOTIFY LAB WITHIN 5 DAYS.   LOWEST DETECTABLE LIMITS FOR URINE DRUG SCREEN Drug Class Cutoff (ng/mL) Amphetamine 1000 Barbiturate 200 Benzodiazepine 782 Tricyclics 956 Opiates 213 Cocaine 300 THC 50   Urine microscopic-add on Status: Abnormal   Collection Time: 02/26/16 10:27 PM  Result Value Ref Range   Squamous Epithelial / LPF 0-5 (A) NONE SEEN   WBC, UA 6-30 0 - 5 WBC/hpf   RBC / HPF NONE SEEN 0 - 5 RBC/hpf   Bacteria, UA RARE (A) NONE SEEN  CBG monitoring, ED Status: Abnormal   Collection Time: 02/26/16 11:15 PM  Result Value Ref Range   Glucose-Capillary 110 (H) 65 - 99 mg/dL      Imaging Results (Last 48 hours)    Ct Head Wo Contrast  02/26/2016 CLINICAL DATA: 25 year old male with level 1 trauma EXAM: CT HEAD WITHOUT CONTRAST CT CERVICAL SPINE WITHOUT CONTRAST TECHNIQUE: Multidetector CT imaging of the head and cervical spine was performed following the standard protocol without intravenous contrast. Multiplanar CT image reconstructions of the cervical spine were also generated. COMPARISON: None. FINDINGS: CT HEAD FINDINGS The ventricles and  the sulci are appropriate in size for the patient's age. There is no intracranial hemorrhage. No midline shift or mass effect identified. The gray-white matter differentiation is preserved. The visualized paranasal sinuses and mastoid air cells are well aerated. The calvarium is intact. CT CERVICAL SPINE FINDINGS There is no acute fracture or subluxation of the cervical spine. There is straightening of normal cervical lordosis which may be positional or due to muscle spasm. The intervertebral disc spaces are preserved.The odontoid and spinous processes are intact.There is normal anatomic alignment of the C1-C2 lateral masses. The visualized soft tissues appear unremarkable. IMPRESSION: No acute intracranial pathology. No acute/ traumatic cervical spine pathology. The above findings were reviewed with Dr. Barry Dienes in person at the time of the study on 02/26/2016 at 10:15 pm. Electronically Signed By: Anner Crete M.D. On: 02/26/2016 22:17   Ct Cervical Spine Wo Contrast  02/26/2016 CLINICAL DATA: 25 year old male with level 1 trauma EXAM: CT HEAD WITHOUT CONTRAST CT CERVICAL SPINE WITHOUT CONTRAST TECHNIQUE: Multidetector CT imaging of the head and cervical spine was performed following the standard protocol without intravenous contrast. Multiplanar CT image reconstructions of the cervical spine were also generated. COMPARISON: None. FINDINGS: CT HEAD FINDINGS The ventricles and the sulci are appropriate in size for the patient's age. There is no intracranial hemorrhage. No midline shift or mass effect identified. The gray-white matter differentiation is preserved. The visualized paranasal sinuses and mastoid air cells are well aerated. The calvarium is intact. CT CERVICAL SPINE FINDINGS There is no acute fracture or subluxation of the cervical spine. There is straightening of normal cervical lordosis which may be positional or due to muscle spasm. The intervertebral disc spaces are preserved.The odontoid  and spinous processes are intact.There is normal anatomic alignment of the C1-C2 lateral masses. The visualized soft tissues appear unremarkable. IMPRESSION: No acute intracranial pathology. No acute/ traumatic cervical spine pathology. The above findings were reviewed with Dr. Barry Dienes in person at the time of the study on 02/26/2016 at 10:15 pm. Electronically Signed By: Anner Crete M.D. On: 02/26/2016 22:17   Dg Chest  Port 1 View  02/26/2016 CLINICAL DATA: Bumped heads while playing basketball 2-3 hours ago, now unresponsive, level 1 trauma EXAM: PORTABLE CHEST 1 VIEW COMPARISON: Portable exam 2150 hours without priors for comparison. FINDINGS: Normal heart size, mediastinal contours, and pulmonary vascularity. Lungs clear. No pleural effusion or pneumothorax. Bones unremarkable. IMPRESSION: No acute abnormalities. Electronically Signed By: Lavonia Dana M.D. On: 02/26/2016 22:35     Review of Systems  Unable to perform ROS: medical condition  Neurological: Positive for loss of consciousness.    Blood pressure 114/75, pulse 50, temperature 99.2 F (37.3 C), temperature source Oral, resp. rate 14, height _0  (1.778 m), weight 65.772 kg (145 lb), SpO2 98 %. Physical Exam  Constitutional: He appears well-developed and well-nourished. He appears lethargic. No distress. Cervical collar in place.  HENT:  Head: Normocephalic. Head is with abrasion.    Right frontal abrasion  Eyes: Conjunctivae are normal. Pupils are equal, round, and reactive to light. Right eye exhibits no discharge. Left eye exhibits no discharge. No scleral icterus.  Gaze favors left  Neck: Neck supple. No JVD present. No tracheal deviation present. No thyromegaly present.  Cardiovascular: Normal rate, regular rhythm, normal heart sounds and intact distal pulses. Exam reveals no gallop and no friction rub.  No murmur heard. Respiratory: Effort normal and breath sounds normal. No respiratory distress. He has  no wheezes. He has no rales. He exhibits no tenderness.  GI: Soft. He exhibits no distension and no mass. There is no tenderness. There is no rebound and no guarding.  Genitourinary: Rectum normal, prostate normal and penis normal.  Musculoskeletal: Normal range of motion. He exhibits no edema or tenderness.  Lymphadenopathy:   He has no cervical adenopathy.  Neurological: He appears lethargic. He exhibits abnormal muscle tone. Coordination abnormal. GCS eye subscore is 4. GCS verbal subscore is 1. GCS motor subscore is 1.  Skin: Skin is warm and dry. No rash noted. He is not diaphoretic. No erythema. No pallor.  Psychiatric:  Eyes open, no interaction.     Assessment/Plan Concussion Hypoglycemia  Given D5LR Allowed to wake up and was ambulatory without dizziness and able to respond appropriately.  No evidence of substance abuse.   Carle Fenech 02/28/2016, 9:16 AM

## 2016-12-03 ENCOUNTER — Other Ambulatory Visit: Payer: Self-pay | Admitting: Occupational Medicine

## 2016-12-03 ENCOUNTER — Ambulatory Visit: Payer: Self-pay

## 2016-12-03 DIAGNOSIS — M25511 Pain in right shoulder: Secondary | ICD-10-CM

## 2016-12-04 ENCOUNTER — Other Ambulatory Visit: Payer: Self-pay | Admitting: Occupational Medicine

## 2016-12-04 ENCOUNTER — Ambulatory Visit: Payer: Self-pay

## 2016-12-04 DIAGNOSIS — M7541 Impingement syndrome of right shoulder: Secondary | ICD-10-CM

## 2018-04-09 IMAGING — CR DG SHOULDER 2+V*R*
4 series · 4 of 4 positions shown · non-contrast
Comparison: No recent prior.

CLINICAL DATA: Fall.  Pain.

EXAM:
RIGHT SHOULDER - 2+ VIEW

[view not recorded (1 of 4)]
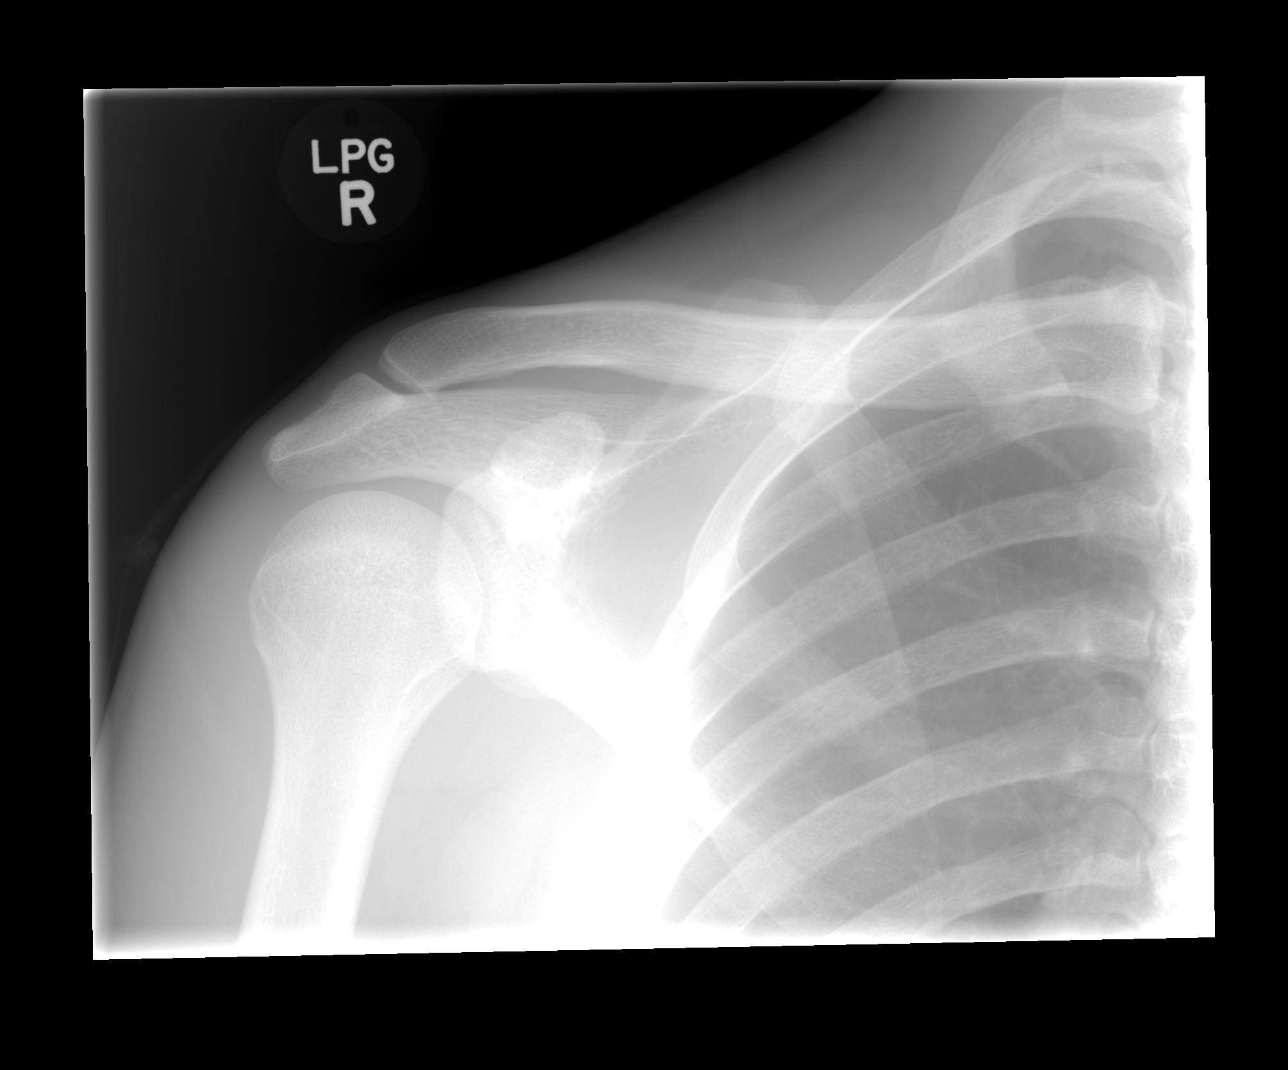

[view not recorded (2 of 4)]
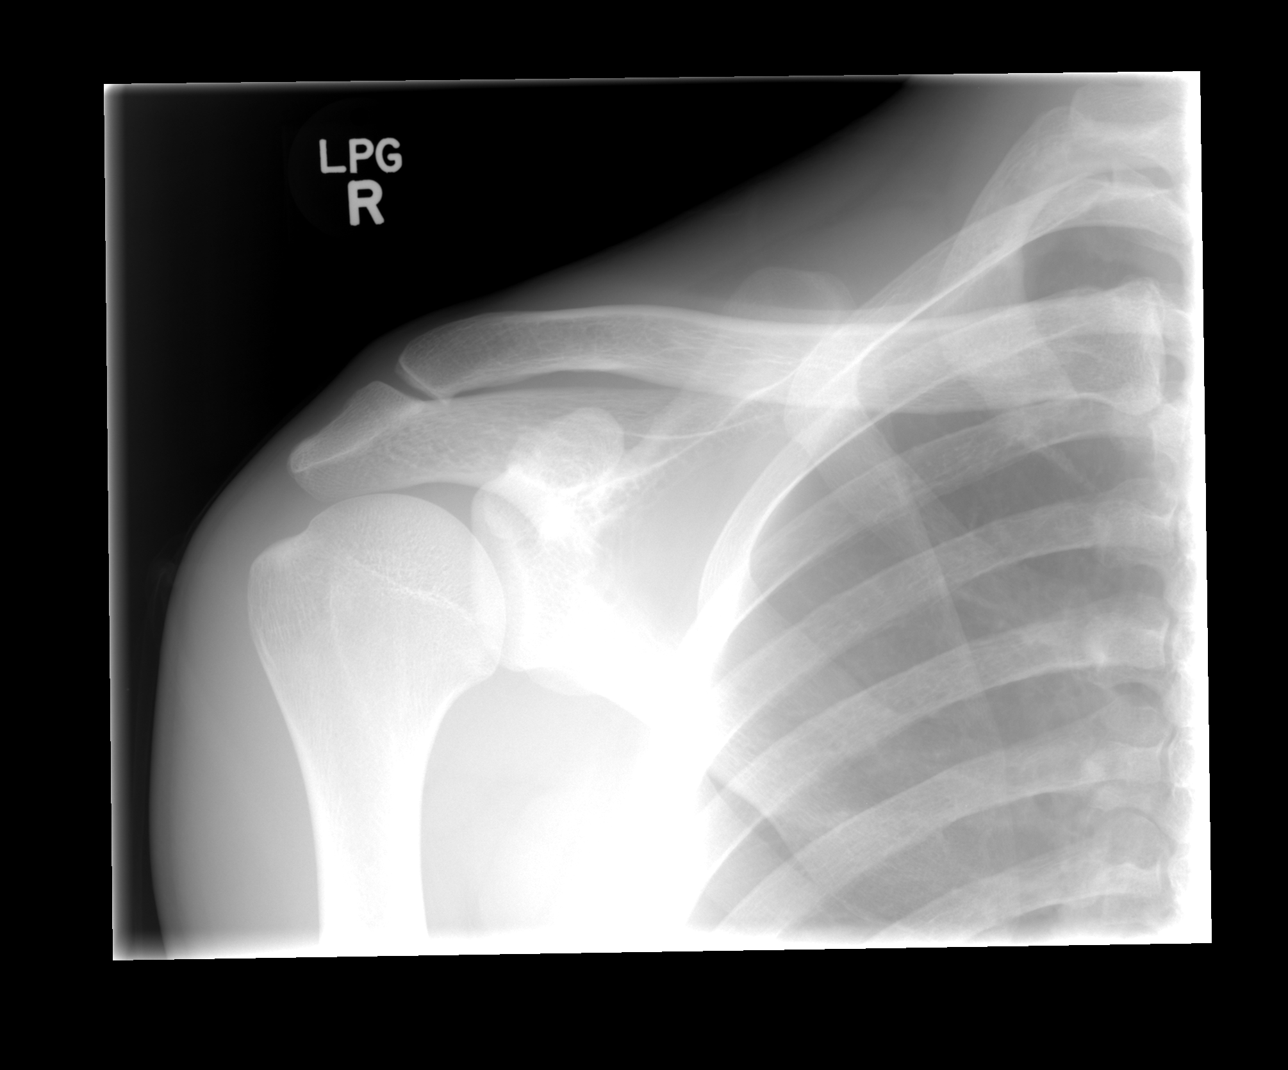

[view not recorded (3 of 4)]
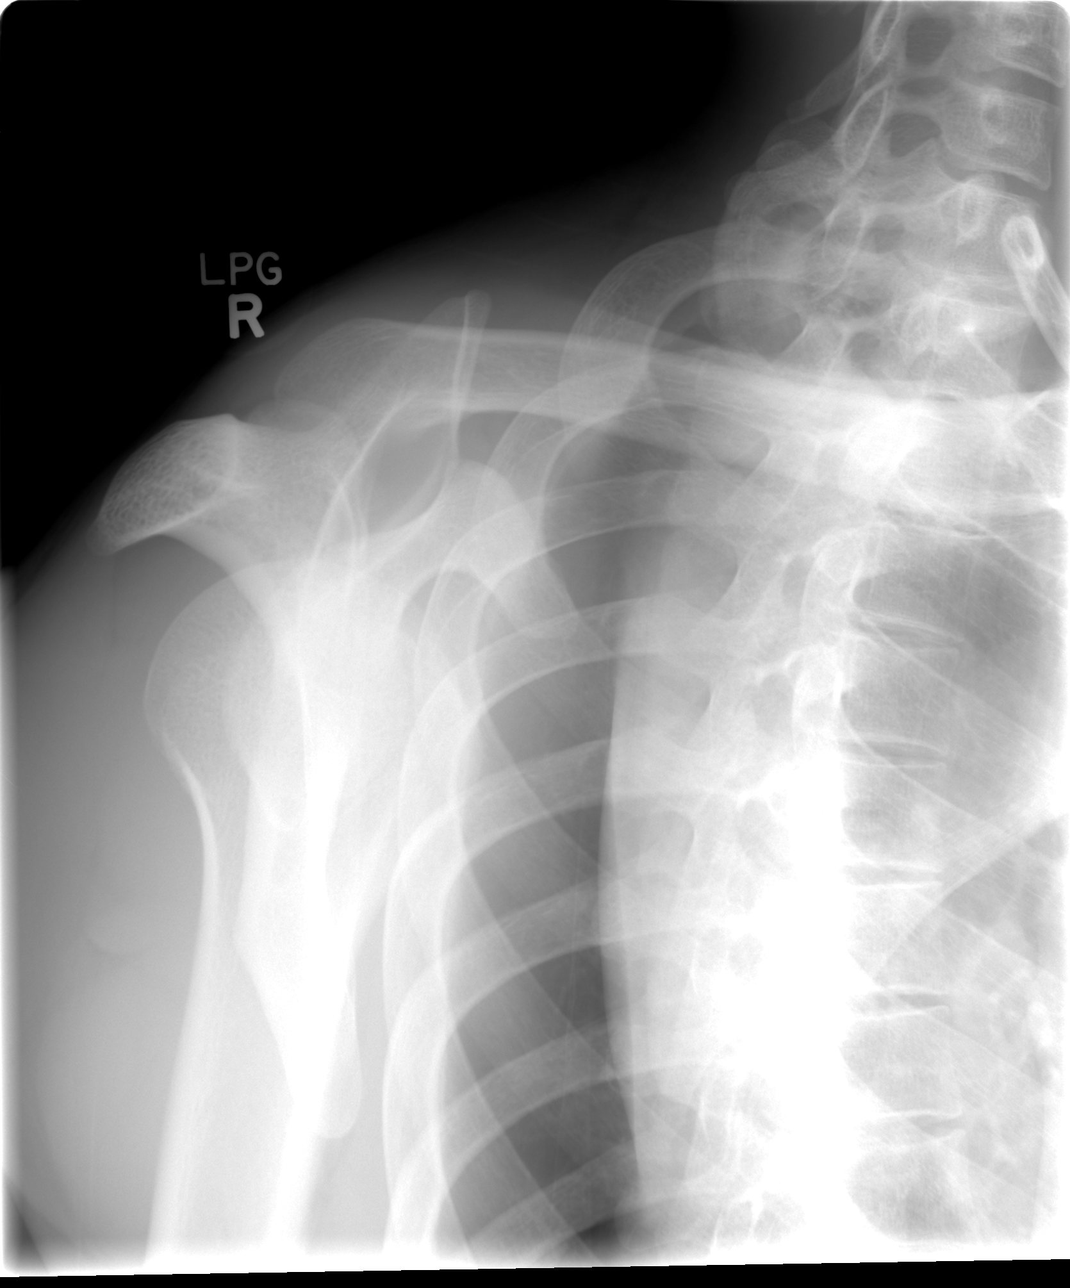

[view not recorded (4 of 4)]
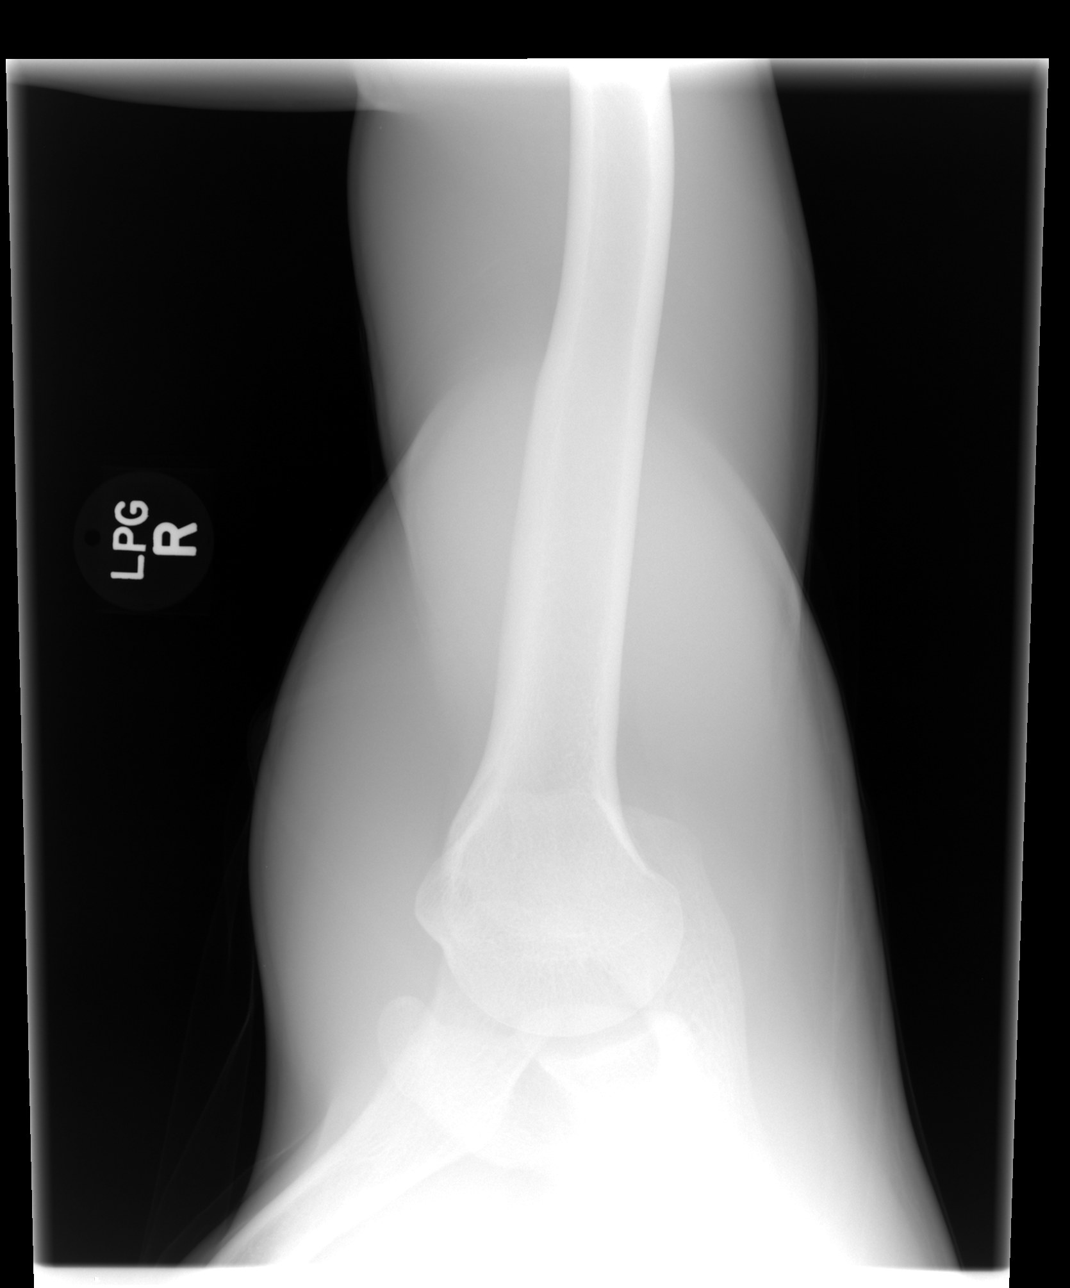

[4 of 4 positions shown; findings below may reference images not displayed]

FINDINGS: No acute bony or joint abnormality identified. No evidence of
fracture or dislocation. Linear soft tissue density is noted along
the right chest on oblique view. This is most likely overlying soft
tissues. This is unlikely to represent a pneumothorax as lung
markings are noted throughout the right lung on frontal views. To
further evaluate the chest and to completely exclude a right sided
pneumothorax PA and lateral chest x-ray is suggested.
IMPRESSION: 1. No acute bony abnormality identified. No evidence of fracture or
dislocation.

2. Soft tissue density is noted along the right chest, most likely
overlying soft tissues. To completely exclude the possibility of
right-sided pneumothorax PA lateral chest x-ray suggested.

Critical Value/emergent results were called by telephone at the time
of interpretation on 12/03/2016 at [DATE] to Dr. ADRYANIE DEPLU , who
verbally acknowledged these results.

## 2019-11-16 ENCOUNTER — Other Ambulatory Visit: Payer: Self-pay

## 2019-11-16 ENCOUNTER — Emergency Department (HOSPITAL_COMMUNITY)
Admission: EM | Admit: 2019-11-16 | Discharge: 2019-11-16 | Disposition: A | Discharge status: Home / Self Care | Payer: BC Managed Care – PPO | Attending: Emergency Medicine | Admitting: Emergency Medicine

## 2019-11-16 ENCOUNTER — Encounter (HOSPITAL_COMMUNITY): Payer: Self-pay | Admitting: Emergency Medicine

## 2019-11-16 DIAGNOSIS — Z20822 Contact with and (suspected) exposure to covid-19: Secondary | ICD-10-CM | POA: Diagnosis not present

## 2019-11-16 DIAGNOSIS — J029 Acute pharyngitis, unspecified: Secondary | ICD-10-CM | POA: Diagnosis present

## 2019-11-16 DIAGNOSIS — J111 Influenza due to unidentified influenza virus with other respiratory manifestations: Secondary | ICD-10-CM | POA: Insufficient documentation

## 2019-11-16 DIAGNOSIS — Z7189 Other specified counseling: Secondary | ICD-10-CM

## 2019-11-16 MED ORDER — ACETAMINOPHEN 325 MG PO TABS
650.0000 mg | ORAL_TABLET | Freq: Once | ORAL | Status: AC | PRN
  Administered 2019-11-16: 650 mg via ORAL

## 2019-11-16 MED ORDER — ACETAMINOPHEN 325 MG PO TABS
ORAL_TABLET | ORAL | Status: AC
  Administered 2019-11-16: 650 mg
  Filled 2019-11-16: qty 2

## 2019-11-16 MED ORDER — LIDOCAINE VISCOUS HCL 2 % MT SOLN: 15.0000 mL | OROMUCOSAL | 0 refills | Status: DC | PRN

## 2019-11-16 NOTE — Discharge Instructions (Signed)
Use the lidocaine to help with throat discomfort. You can swish and spit as needed. Take Tylenol and ibuprofen to help with symptoms. Increase your fluid intake. Return to the ED for chest pain, shortness of breath, trouble swallowing, neck pain or stiffness.

## 2019-11-16 NOTE — ED Triage Notes (Signed)
Pt c/o fever, sore throat, fatigue, cough since yesterday.

## 2019-11-16 NOTE — ED Provider Notes (Signed)
[facility] DEPT Provider Note   CSN: [id number] Arrival date & time: 11/16/19  2008     History Chief Complaint  Patient presents with  . Sore Throat  . Fever    Thomas Padilla is a 29 y.o. male with no significant past medical history presents to ED for 1 day history of chills, generalized body aches, dry cough and sore throat.  He has taken 1 dose of aspirin with only minimal improvement in his symptoms.  No known COVID-19 exposures or sick contacts with similar symptoms.  Denies any vomiting, chest pain, shortness of breath, abdominal pain, trismus, drooling.  HPI     History reviewed. No pertinent past medical history.  There are no problems to display for this patient.   History reviewed. No pertinent surgical history.     No family history on file.  Social History   Tobacco Use  . Smoking status: Never Smoker  . Smokeless tobacco: Never Used  Substance Use Topics  . Alcohol use: No  . Drug use: No    Home Medications Prior to Admission medications   Medication Sig Start Date End Date Taking? Authorizing Provider  lidocaine (XYLOCAINE) 2 % solution Use as directed 15 mLs in the mouth or throat as needed for mouth pain. 11/16/19   Lossie Kalp, PA-C  ondansetron (ZOFRAN ODT) 8 MG disintegrating tablet Take 1 tablet (8 mg total) by mouth every 8 (eight) hours as needed for nausea or vomiting. 12/26/15   Molpus, Jenny Reichmann, MD    Allergies    Patient has no known allergies.  Review of Systems   Review of Systems  Constitutional: Positive for chills, fatigue and fever.  HENT: Positive for sore throat. Negative for drooling, facial swelling, trouble swallowing and voice change.   Respiratory: Positive for cough. Negative for shortness of breath.   Cardiovascular: Negative for chest pain.  Musculoskeletal: Positive for myalgias.    Physical Exam Updated Vital Signs BP 114/80   Pulse 88   Temp 99.5 F (37.5 C)   Resp 18   Ht 5'  11" (1.803 m)   Wt 69.3 kg   SpO2 98%   BMI 21.31 kg/m   Physical Exam Vitals and nursing note reviewed.  Constitutional:      General: He is not in acute distress.    Appearance: He is well-developed. He is not diaphoretic.     Comments: Speaking complete sentences without difficulty.  HENT:     Head: Normocephalic and atraumatic.     Mouth/Throat:     Pharynx: Oropharynx is clear. Uvula midline. Posterior oropharyngeal erythema present. No oropharyngeal exudate.     Tonsils: No tonsillar exudate or tonsillar abscesses. 0 on the right. 0 on the left.     Comments: Patient does not appear to be in acute distress. No trismus or drooling present. No pooling of secretions. Patient is tolerating secretions and is not in respiratory distress. No neck pain or tenderness to palpation of the neck. Full active and passive range of motion of the neck. No evidence of RPA or PTA. Eyes:     General: No scleral icterus.    Conjunctiva/sclera: Conjunctivae normal.  Cardiovascular:     Rate and Rhythm: Normal rate and regular rhythm.     Heart sounds: Normal heart sounds.  Pulmonary:     Effort: Pulmonary effort is normal. No respiratory distress.     Breath sounds: Normal breath sounds.  Musculoskeletal:     Cervical back: Normal range  of motion.  Skin:    Findings: No rash.  Neurological:     Mental Status: He is alert.     ED Results / Procedures / Treatments   Labs (all labs ordered are listed, but only abnormal results are displayed) Labs Reviewed  SARS CORONAVIRUS 2 (TAT 6-24 HRS)  INFLUENZA PANEL BY PCR (TYPE A & B)    EKG None  Radiology No results found.  Procedures Procedures (including critical care time)  Medications Ordered in ED Medications  acetaminophen (TYLENOL) tablet 650 mg (650 mg Oral Given 11/16/19 2049)  acetaminophen (TYLENOL) 325 MG tablet (650 mg  Given 11/16/19 2142)    ED Course  I have reviewed the triage vital signs and the nursing  notes.  Pertinent labs & imaging results that were available during my care of the patient were reviewed by me and considered in my medical decision making (see chart for details).    MDM Rules/Calculators/A&P                      Phyllis Whitefield was evaluated in Emergency Department on 11/16/19 for the symptoms described in the history of present illness. He/she was evaluated in the context of the global COVID-19 pandemic, which necessitated consideration that the patient might be at risk for infection with the SARS-CoV-2 virus that causes COVID-19. Institutional protocols and algorithms that pertain to the evaluation of patients at risk for COVID-19 are in a state of rapid change based on information released by regulatory bodies including the CDC and federal and state organizations. These policies and algorithms were followed during the patient's care in the ED.  29 year old male presents to ED for 1 day history of generalized body aches, fatigue, chills, sore throat and cough.  He appears overall well on exam. No signs of RPA, PTA, respiratory distress. Lungs are clear to auscultation. Will obtain Covid test and flu test and have patient follow-up with results.  In the meantime we will have him continue antipyretics, give lidocaine to swish and spit and increase hydration. He is not hypoxic today and his fever improved here with antipyretics.  Patient is hemodynamically stable, in NAD, and able to ambulate in the ED. Evaluation does not show pathology that would require ongoing emergent intervention or inpatient treatment. I explained the diagnosis to the patient. Pain has been managed and has no complaints prior to discharge. Patient is comfortable with above plan and is stable for discharge at this time. All questions were answered prior to disposition. Strict return precautions for returning to the ED were discussed. Encouraged follow up with PCP.   An After Visit Summary was printed and given  to the patient.   Portions of this note were generated with Scientist, clinical (histocompatibility and immunogenetics). Dictation errors may occur despite best attempts at proofreading.  Final Clinical Impression(s) / ED Diagnoses Final diagnoses:  Influenza-like illness  Educated about 2019 novel coronavirus infection    Rx / DC Orders ED Discharge Orders         Ordered    lidocaine (XYLOCAINE) 2 % solution  As needed     11/16/19 2218           Dietrich Pates, PA-C 11/16/19 2252    Jacalyn Lefevre, MD 11/16/19 2300

## 2019-11-17 LAB — SARS CORONAVIRUS 2 (TAT 6-24 HRS): SARS Coronavirus 2: NEGATIVE

## 2019-12-13 ENCOUNTER — Emergency Department (HOSPITAL_COMMUNITY)
Admission: EM | Admit: 2019-12-13 | Discharge: 2019-12-14 | Disposition: A | Discharge status: Home / Self Care | Payer: BC Managed Care – PPO | Attending: Emergency Medicine | Admitting: Emergency Medicine

## 2019-12-13 ENCOUNTER — Other Ambulatory Visit: Payer: Self-pay

## 2019-12-13 ENCOUNTER — Encounter (HOSPITAL_COMMUNITY): Payer: Self-pay | Admitting: Emergency Medicine

## 2019-12-13 DIAGNOSIS — R112 Nausea with vomiting, unspecified: Secondary | ICD-10-CM | POA: Insufficient documentation

## 2019-12-13 DIAGNOSIS — K529 Noninfective gastroenteritis and colitis, unspecified: Secondary | ICD-10-CM | POA: Insufficient documentation

## 2019-12-13 DIAGNOSIS — R531 Weakness: Secondary | ICD-10-CM | POA: Insufficient documentation

## 2019-12-13 DIAGNOSIS — R197 Diarrhea, unspecified: Secondary | ICD-10-CM | POA: Diagnosis not present

## 2019-12-13 DIAGNOSIS — R1032 Left lower quadrant pain: Secondary | ICD-10-CM | POA: Diagnosis present

## 2019-12-13 LAB — CBC
HCT: 48 % (ref 39.0–52.0)
Hemoglobin: 15 g/dL (ref 13.0–17.0)
MCH: 26.4 pg (ref 26.0–34.0)
MCHC: 31.3 g/dL (ref 30.0–36.0)
MCV: 84.4 fL (ref 80.0–100.0)
Platelets: 254 10*3/uL (ref 150–400)
RBC: 5.69 MIL/uL (ref 4.22–5.81)
RDW: 13.2 % (ref 11.5–15.5)
WBC: 10.1 10*3/uL (ref 4.0–10.5)
nRBC: 0 % (ref 0.0–0.2)

## 2019-12-13 LAB — COMPREHENSIVE METABOLIC PANEL
ALT: 39 U/L (ref 0–44)
AST: 27 U/L (ref 15–41)
Albumin: 4.6 g/dL (ref 3.5–5.0)
Alkaline Phosphatase: 35 U/L — ABNORMAL LOW (ref 38–126)
Anion gap: 8 (ref 5–15)
BUN: 15 mg/dL (ref 6–20)
CO2: 27 mmol/L (ref 22–32)
Calcium: 9.6 mg/dL (ref 8.9–10.3)
Chloride: 105 mmol/L (ref 98–111)
Creatinine, Ser: 1.12 mg/dL (ref 0.61–1.24)
GFR calc Af Amer: >60 mL/min (ref >60)
GFR calc non Af Amer: >60 mL/min (ref >60)
Glucose, Bld: 109 mg/dL — ABNORMAL HIGH (ref 70–99)
Potassium: 3.7 mmol/L (ref 3.5–5.1)
Sodium: 140 mmol/L (ref 135–145)
Total Bilirubin: 1.4 mg/dL — ABNORMAL HIGH (ref 0.3–1.2)
Total Protein: 7.8 g/dL (ref 6.5–8.1)

## 2019-12-13 LAB — URINALYSIS, ROUTINE W REFLEX MICROSCOPIC
Bacteria, UA: NONE SEEN
Bilirubin Urine: NEGATIVE
Glucose, UA: NEGATIVE mg/dL
Ketones, ur: 5 mg/dL — AB
Leukocytes,Ua: NEGATIVE
Nitrite: NEGATIVE
Protein, ur: NEGATIVE mg/dL
Specific Gravity, Urine: 1.023 (ref 1.005–1.030)
pH: 5 (ref 5.0–8.0)

## 2019-12-13 LAB — LIPASE, BLOOD: Lipase: 16 U/L (ref 11–51)

## 2019-12-13 MED ORDER — SODIUM CHLORIDE 0.9% FLUSH: 3.0000 mL | Freq: Once | INTRAVENOUS | Status: DC

## 2019-12-13 NOTE — ED Notes (Signed)
Thomas Padilla mother [phone]

## 2019-12-13 NOTE — ED Triage Notes (Signed)
Patient reports left abdominal pain . Left lateral back pain with emesis and diarrhea onset this week .

## 2019-12-14 ENCOUNTER — Emergency Department (HOSPITAL_COMMUNITY): Payer: BC Managed Care – PPO

## 2019-12-14 ENCOUNTER — Encounter (HOSPITAL_COMMUNITY): Payer: Self-pay | Admitting: Radiology

## 2019-12-14 MED ORDER — DICYCLOMINE HCL 20 MG PO TABS: 20.0000 mg | ORAL_TABLET | Freq: Two times a day (BID) | ORAL | 0 refills | Status: DC

## 2019-12-14 MED ORDER — IOHEXOL 300 MG/ML  SOLN
80.0000 mL | Freq: Once | INTRAMUSCULAR | Status: AC | PRN
  Administered 2019-12-14: 80 mL via INTRAVENOUS

## 2019-12-14 MED ORDER — HYDROMORPHONE HCL 1 MG/ML IJ SOLN
0.5000 mg | Freq: Once | INTRAMUSCULAR | Status: AC
  Administered 2019-12-14: 0.5 mg via INTRAVENOUS
  Filled 2019-12-14: qty 1

## 2019-12-14 MED ORDER — ONDANSETRON HCL 4 MG/2ML IJ SOLN
4.0000 mg | Freq: Once | INTRAMUSCULAR | Status: AC
  Administered 2019-12-14: 4 mg via INTRAVENOUS
  Filled 2019-12-14: qty 2

## 2019-12-14 MED ORDER — ONDANSETRON 4 MG PO TBDP: 4.0000 mg | ORAL_TABLET | Freq: Three times a day (TID) | ORAL | 0 refills | Status: DC | PRN

## 2019-12-14 NOTE — ED Notes (Signed)
EDP at bedside  

## 2019-12-14 NOTE — ED Notes (Signed)
Assumed care of pt. Pt a&ox4, VSS on continuous monitors. Breathing easy, non-labored. Speaking in full sentences. Pt endorses N/V/D and L sided flank pain since yesterday. Denies CP/SOB. Denies fevers. Denies GU s/s

## 2019-12-14 NOTE — ED Notes (Signed)
Pt taken to and from CT without incidence 

## 2019-12-14 NOTE — ED Notes (Signed)
PIV initiated, 18G to R forearm. IV flushes with 10 cc NS without s/s of infiltration. Positive blood return noted. Secured with tape and tegaderm. Meds given per MAR. Name/DOB verified with pt

## 2019-12-14 NOTE — ED Notes (Signed)
Patient verbalizes understanding of discharge instructions, prescription medications, and follow up information. Opportunity for questioning and answers were provided. All questions answered completely. PIV removed, catheter intact. Site dressed with gauze and tape. Armband removed by staff, pt discharged from ED. Ambulatory from ED with strong, steady gait

## 2019-12-14 NOTE — ED Provider Notes (Signed)
[facility] EMERGENCY DEPARTMENT Provider Note   CSN: [id number] Arrival date & time: 12/13/19  2117     History Chief Complaint  Patient presents with   Abdominal Pain   Emesis    Thomas Padilla is a 29 y.o. male.  Patient to ED with complaint of left sided abdominal and flank pain that started 2 days ago after a fall. He reports slipping while walking and falling onto left side. He experience soreness in the area but symptoms progressed to include nausea, vomiting, diarrhea and increased pain. No reported fever. He denies hematuria. He has been ambulatory and denies significant hip or pelvic pain. No difficulty urinating, groin pain, SOB or CP.   The history is provided by the patient. No language interpreter was used.       History reviewed. No pertinent past medical history.  There are no problems to display for this patient.   History reviewed. No pertinent surgical history.     No family history on file.  Social History   Tobacco Use   Smoking status: Never Smoker   Smokeless tobacco: Never Used  Substance Use Topics   Alcohol use: No   Drug use: No    Home Medications Prior to Admission medications   Medication Sig Start Date End Date Taking? Authorizing Provider  lidocaine (XYLOCAINE) 2 % solution Use as directed 15 mLs in the mouth or throat as needed for mouth pain. 11/16/19   Khatri, Hina, PA-C  ondansetron (ZOFRAN ODT) 8 MG disintegrating tablet Take 1 tablet (8 mg total) by mouth every 8 (eight) hours as needed for nausea or vomiting. 12/26/15   Molpus, Jonny Ruiz, MD    Allergies    Patient has no known allergies.  Review of Systems   Review of Systems  Constitutional: Negative for chills and fever.  Respiratory: Negative.  Negative for shortness of breath.   Cardiovascular: Negative.  Negative for chest pain.  Gastrointestinal: Positive for abdominal pain, diarrhea, nausea and vomiting. Negative for blood in stool.    Genitourinary: Positive for flank pain. Negative for difficulty urinating and hematuria.  Skin: Negative.   Neurological: Positive for weakness (Generalized, mild).    Physical Exam Updated Vital Signs BP (!) 95/57    Pulse 81    Temp 98.4 F (36.9 C) (Oral)    Resp 17    Ht 5\' 10"  (1.778 m)    Wt 75 kg    SpO2 95%    BMI 23.72 kg/m   Physical Exam Vitals and nursing note reviewed.  Constitutional:      Appearance: He is well-developed.  HENT:     Head: Normocephalic.  Cardiovascular:     Rate and Rhythm: Normal rate and regular rhythm.  Pulmonary:     Effort: Pulmonary effort is normal.     Breath sounds: Normal breath sounds.  Abdominal:     General: Bowel sounds are normal.     Palpations: Abdomen is soft.     Tenderness: There is abdominal tenderness (Left mid- and lower abdominal tenderness, extending to lateral wall and left flank. No peritonitis.). There is no guarding or rebound.  Genitourinary:    Penis: Normal.      Testes: Normal.        Right: Tenderness or swelling not present.        Left: Tenderness or swelling not present.  Musculoskeletal:        General: Normal range of motion.     Cervical back: Normal range  of motion and neck supple.  Skin:    General: Skin is warm and dry.     Findings: No rash.  Neurological:     Mental Status: He is alert and oriented to person, place, and time.     ED Results / Procedures / Treatments   Labs (all labs ordered are listed, but only abnormal results are displayed) Labs Reviewed  COMPREHENSIVE METABOLIC PANEL - Abnormal; Notable for the following components:      Result Value   Glucose, Bld 109 (*)    Alkaline Phosphatase 35 (*)    Total Bilirubin 1.4 (*)    All other components within normal limits  URINALYSIS, ROUTINE W REFLEX MICROSCOPIC - Abnormal; Notable for the following components:   Hgb urine dipstick MODERATE (*)    Ketones, ur 5 (*)    All other components within normal limits  LIPASE, BLOOD   CBC    EKG None  Radiology CT ABDOMEN PELVIS W CONTRAST  Result Date: 12/14/2019 CLINICAL DATA:  Abdominal trauma, microscopic hematuria left-sided abdominal pain with nausea vomiting diarrhea EXAM: CT ABDOMEN AND PELVIS WITH CONTRAST TECHNIQUE: Multidetector CT imaging of the abdomen and pelvis was performed using the standard protocol following bolus administration of intravenous contrast. CONTRAST:  69mL OMNIPAQUE IOHEXOL 300 MG/ML  SOLN COMPARISON:  None. FINDINGS: Lower chest: The visualized heart size within normal limits. No pericardial fluid/thickening. No hiatal hernia. The visualized portions of the lungs are clear. Hepatobiliary: The liver is normal in density without focal abnormality.The main portal vein is patent. No evidence of calcified gallstones, gallbladder wall thickening or biliary dilatation. Pancreas: Unremarkable. No pancreatic ductal dilatation or surrounding inflammatory changes. Spleen: Normal in size without focal abnormality. Adrenals/Urinary Tract: Both adrenal glands appear normal. The kidneys and collecting system appear normal without evidence of urinary tract calculus or hydronephrosis. Bladder is unremarkable. Stomach/Bowel: The stomach, and small bowel are normal in appearance. The descending colon appears to be decompressed with apparent wall thickening. No significant surrounding mesenteric inflammatory changes are seen. No loculated fluid collections. The appendix is unremarkable. Vascular/Lymphatic: There are no enlarged mesenteric, retroperitoneal, or pelvic lymph nodes. No significant vascular findings are present. Reproductive: The prostate is unremarkable. Other: No evidence of abdominal wall mass or hernia. Musculoskeletal: No acute or significant osseous findings. IMPRESSION: Apparent wall thickening of the descending colon which could be due to decompression versus mild colitis. No pneumoperitoneum or loculated fluid collections. Electronically Signed   By:  Prudencio Pair M.D.   On: 12/14/2019 04:41    Procedures Procedures (including critical care time)  Medications Ordered in ED Medications  sodium chloride flush (NS) 0.9 % injection 3 mL (3 mLs Intravenous Not Given 12/14/19 0410)  HYDROmorphone (DILAUDID) injection 0.5 mg (0.5 mg Intravenous Given 12/14/19 0408)  ondansetron (ZOFRAN) injection 4 mg (4 mg Intravenous Given 12/14/19 0416)  iohexol (OMNIPAQUE) 300 MG/ML solution 80 mL (80 mLs Intravenous Contrast Given 12/14/19 0430)    ED Course  I have reviewed the triage vital signs and the nursing notes.  Pertinent labs & imaging results that were available during my care of the patient were reviewed by me and considered in my medical decision making (see chart for details).    MDM Rules/Calculators/A&P                      Patient to ED with left abdominal and flank pain, progressive since fall 2 days ago, now with diarrhea and N, V. No fever.  He is tender of left abdomen and flank. There is microscopic hematuria on UA. Concern for organ injury from fall - CT abd/pel trauma ordered. Pain medication provided.   On re-evaluation, the patient is comfortable. Nausea and pain controlled. Continue NPO status until CT resulted.   CT findings c/w descending colitis which better explains GI symptoms of diarrhea and vomiting. No organ injury.   VSS. He is considered stable for discharge home.   Final Clinical Impression(s) / ED Diagnoses Final diagnoses:  None   1. Colitis   Rx / DC Orders ED Discharge Orders    None       Elpidio Anis, PA-C 12/15/19 8841    Shon Baton, MD 12/17/19 [phone]

## 2019-12-14 NOTE — Discharge Instructions (Addendum)
Follow up with a primary care provider of your choice for outpatient care.   Return to the emergency department with any  new or concerning symptoms.

## 2020-09-23 ENCOUNTER — Other Ambulatory Visit: Payer: Self-pay

## 2020-09-23 ENCOUNTER — Emergency Department (HOSPITAL_BASED_OUTPATIENT_CLINIC_OR_DEPARTMENT_OTHER)
Admission: EM | Admit: 2020-09-23 | Discharge: 2020-09-23 | Disposition: A | Discharge status: Home / Self Care | Payer: BC Managed Care – PPO | Attending: Emergency Medicine | Admitting: Emergency Medicine

## 2020-09-23 ENCOUNTER — Encounter (HOSPITAL_BASED_OUTPATIENT_CLINIC_OR_DEPARTMENT_OTHER): Payer: Self-pay | Admitting: Emergency Medicine

## 2020-09-23 DIAGNOSIS — R519 Headache, unspecified: Secondary | ICD-10-CM | POA: Insufficient documentation

## 2020-09-23 DIAGNOSIS — Y92410 Unspecified street and highway as the place of occurrence of the external cause: Secondary | ICD-10-CM | POA: Diagnosis not present
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
## 2020-09-23 NOTE — Discharge Instructions (Signed)
Continue to use Tylenol and ibuprofen or Aleve as needed for headache. Avoid things that make her headache worse until your symptoms improve. Follow-up in the clinic listed below as needed for persistent headache. Return to the emergency room if you develop severe worsening pain, vision changes, vomiting, any new, worsening, or concerning symptoms

## 2020-09-23 NOTE — ED Notes (Signed)
Pt discharged to home. Discharge instructions have been discussed with patient and/or family members. Pt verbally acknowledges understanding d/c instructions, and endorses comprehension to checkout at registration before leaving.  °
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
## 2021-05-12 ENCOUNTER — Emergency Department
Admission: EM | Admit: 2021-05-12 | Discharge: 2021-05-12 | Disposition: A | Discharge status: Home / Self Care | Payer: BC Managed Care – PPO | Attending: Emergency Medicine | Admitting: Emergency Medicine

## 2021-05-12 ENCOUNTER — Other Ambulatory Visit: Payer: Self-pay

## 2021-05-12 DIAGNOSIS — T7840XA Allergy, unspecified, initial encounter: Secondary | ICD-10-CM | POA: Diagnosis not present

## 2021-05-12 MED ORDER — METHYLPREDNISOLONE SODIUM SUCC 125 MG IJ SOLR
125.0000 mg | Freq: Once | INTRAMUSCULAR | Status: AC
  Administered 2021-05-12: 125 mg via INTRAVENOUS

## 2021-05-12 MED ORDER — PREDNISONE 20 MG PO TABS: 60.0000 mg | ORAL_TABLET | Freq: Every day | ORAL | 0 refills | Status: AC

## 2021-05-12 MED ORDER — FAMOTIDINE IN NACL 20-0.9 MG/50ML-% IV SOLN
20.0000 mg | Freq: Once | INTRAVENOUS | Status: AC
  Administered 2021-05-12: 20 mg via INTRAVENOUS

## 2021-05-12 MED ORDER — DIPHENHYDRAMINE HCL 50 MG/ML IJ SOLN
50.0000 mg | Freq: Once | INTRAMUSCULAR | Status: AC
  Administered 2021-05-12: 50 mg via INTRAVENOUS

## 2021-05-12 MED ORDER — EPINEPHRINE 0.3 MG/0.3ML IJ SOAJ: 0.3000 mg | Freq: Once | INTRAMUSCULAR | 0 refills | Status: AC

## 2021-05-12 NOTE — ED Notes (Signed)
Pt reports feeling improved, no angioedema noted. No resp distress noted.

## 2021-05-12 NOTE — ED Triage Notes (Signed)
Pt comoplains of allergic reaction to unknown substance. Pt with small amount of rash noted to arms. Pt states his face is itching and he feels "like my throat is closing up". Pt appears in no acute distress without angioedema in triage.

## 2021-05-12 NOTE — Discharge Instructions (Addendum)

## 2021-05-12 NOTE — ED Notes (Signed)
Pt resting, no resp distress noted.

## 2021-05-12 NOTE — ED Provider Notes (Signed)
[facility] Emergency Department Provider Note  ____________________________________________   Event Date/Time   First MD Initiated Contact with Patient 05/12/21 [phone]     (approximate)  I have reviewed the triage vital signs and the nursing notes.   HISTORY  Chief Complaint Allergic Reaction    HPI Thomas Padilla is a 30 y.o. male who presents for evaluation of allergic reaction.  He states that this is never happened to him before but his sister has a severe allergy to peanuts and has to carry around an EpiPen.  The patient ate at [employer] and not long after he developed itching in his face with a rash on his arms that was also itchy.  He states that he felt like his throat was closing and that he could not breathe although in triage she was speaking easily and clearly with no obvious swelling and no wheezing or stridor.  The symptoms were acute in onset and severe.  Nothing in particular made it better or worse but he had not tried taking any medicine.  He denies nausea, vomiting, abdominal pain, and dysuria.  No chest pain.  No swelling of the face but itchy rashes present on the face and the arms.     No past medical history on file.  There are no problems to display for this patient.   No past surgical history on file.  Prior to Admission medications   Medication Sig Start Date End Date Taking? Authorizing Provider  EPINEPHrine (EPIPEN 2-PAK) 0.3 mg/0.3 mL IJ SOAJ injection Inject 0.3 mg into the muscle once for 1 dose. Take for severe allergic reaction, then come immediately to the Emergency Department or call 911. 05/12/21 05/12/21 Yes Loleta Rose, MD  predniSONE (DELTASONE) 20 MG tablet Take 3 tablets (60 mg total) by mouth daily with breakfast for 5 days. 05/12/21 05/17/21 Yes Loleta Rose, MD  dicyclomine (BENTYL) 20 MG tablet Take 1 tablet (20 mg total) by mouth 2 (two) times daily. 12/14/19   Elpidio Anis, PA-C  lidocaine  (XYLOCAINE) 2 % solution Use as directed 15 mLs in the mouth or throat as needed for mouth pain. 11/16/19   Khatri, Hina, PA-C  ondansetron (ZOFRAN ODT) 4 MG disintegrating tablet Take 1 tablet (4 mg total) by mouth every 8 (eight) hours as needed for nausea or vomiting. 12/14/19   Elpidio Anis, PA-C    Allergies Patient has no known allergies.  No family history on file.  Social History Social History   Tobacco Use   Smoking status: Never   Smokeless tobacco: Never  Substance Use Topics   Alcohol use: No   Drug use: No    Review of Systems Constitutional: No fever/chills Eyes: No visual changes. ENT: Positive for throat tightening. Cardiovascular: Denies chest pain. Respiratory: Positive for shortness of breath. Gastrointestinal: No abdominal pain.  No nausea, no vomiting.  No diarrhea.  No constipation. Genitourinary: Negative for dysuria. Musculoskeletal: Negative for neck pain.  Negative for back pain. Integumentary: Positive for itching and hives on the patient's face and arms. Neurological: Negative for headaches, focal weakness or numbness.   ____________________________________________   PHYSICAL EXAM:  VITAL SIGNS: ED Triage Vitals [05/12/21 0052]  Enc Vitals Group     BP 126/73     Pulse Rate 63     Resp 16     Temp 98.2 F (36.8 C)     Temp Source Oral     SpO2 100 %     Weight 63.5 kg (  140 lb)     Height 1.778 m (5\' 10" )     Head Circumference      Peak Flow      Pain Score 0     Pain Loc      Pain Edu?      Excl. in GC?     Constitutional: Alert and oriented.  Eyes: Conjunctivae are normal.  Head: Atraumatic. Nose: No congestion/rhinnorhea. Mouth/Throat: Patient is wearing a mask. Neck: No stridor.  No meningeal signs.   Cardiovascular: Normal rate, regular rhythm. Good peripheral circulation. Respiratory: Normal respiratory effort.  No retractions. Gastrointestinal: Soft and nontender. No distention.  Musculoskeletal: No lower extremity  tenderness nor edema. No gross deformities of extremities. Neurologic:  Normal speech and language. No gross focal neurologic deficits are appreciated.  Skin:  Skin is warm, dry and intact.  No evidence of urticaria upon my exam. Psychiatric: Mood and affect are normal. Speech and behavior are normal.  ____________________________________________   LABS (all labs ordered are listed, but only abnormal results are displayed)  Labs Reviewed - No data to display  ____________________________________________   INITIAL IMPRESSION / MDM / ASSESSMENT AND PLAN / ED COURSE  As part of my medical decision making, I reviewed the following data within the electronic MEDICAL RECORD NUMBER Nursing notes reviewed and incorporated and Notes from prior ED visits   Differential diagnosis includes, but is not limited to, allergic reaction, anaphylaxis, anxiety/panic attack.  Patient initially seemed to having relatively mild symptoms but he was very concerned about them.  His triage nurse approached me and I authorize the use of diphenhydramine 50 mg IV, famotidine 20 mg IV, and methylprednisolone 125 mg IV.  I assessed him after a few hours and he was completely asymptomatic.  He has been able to sleep, his shortness of breath and rash completely resolved.  No evidence of anaphylaxis.  I provided prescriptions as listed below and had my usual and customary allergic reaction/anaphylaxis discussion with the patient.  He understands and agrees with the plan.     ____________________________________________  FINAL CLINICAL IMPRESSION(S) / ED DIAGNOSES  Final diagnoses:  Allergic reaction, initial encounter     MEDICATIONS GIVEN DURING THIS VISIT:  Medications  famotidine (PEPCID) IVPB 20 mg premix (0 mg Intravenous Stopped 05/12/21 0131)  methylPREDNISolone sodium succinate (SOLU-MEDROL) 125 mg/2 mL injection 125 mg (125 mg Intravenous Given 05/12/21 0100)  diphenhydrAMINE (BENADRYL) injection 50 mg (50  mg Intravenous Given 05/12/21 0100)     ED Discharge Orders          Ordered    predniSONE (DELTASONE) 20 MG tablet  Daily with breakfast        05/12/21 0521    EPINEPHrine (EPIPEN 2-PAK) 0.3 mg/0.3 mL IJ SOAJ injection   Once        05/12/21 05/14/21             Note:  This document was prepared using Dragon voice recognition software and may include unintentional dictation errors.   [id number], MD 05/12/21 [phone]

## 2022-03-11 ENCOUNTER — Other Ambulatory Visit: Payer: Self-pay

## 2022-03-11 ENCOUNTER — Emergency Department
Admission: EM | Admit: 2022-03-11 | Discharge: 2022-03-11 | Disposition: A | Discharge status: Home / Self Care | Payer: BC Managed Care – PPO | Attending: Emergency Medicine | Admitting: Emergency Medicine

## 2022-03-11 DIAGNOSIS — R112 Nausea with vomiting, unspecified: Secondary | ICD-10-CM | POA: Diagnosis present

## 2022-03-11 DIAGNOSIS — K529 Noninfective gastroenteritis and colitis, unspecified: Secondary | ICD-10-CM | POA: Insufficient documentation

## 2022-03-11 DIAGNOSIS — Z20822 Contact with and (suspected) exposure to covid-19: Secondary | ICD-10-CM | POA: Diagnosis not present

## 2022-03-11 LAB — CBC WITH DIFFERENTIAL/PLATELET
Abs Immature Granulocytes: 0.02 10*3/uL (ref 0.00–0.07)
Basophils Absolute: 0 10*3/uL (ref 0.0–0.1)
Basophils Relative: 0 %
Eosinophils Absolute: 0.1 10*3/uL (ref 0.0–0.5)
Eosinophils Relative: 1 %
HCT: 48.1 % (ref 39.0–52.0)
Hemoglobin: 15.1 g/dL (ref 13.0–17.0)
Immature Granulocytes: 0 %
Lymphocytes Relative: 21 %
Lymphs Abs: 1.5 10*3/uL (ref 0.7–4.0)
MCH: 25.9 pg — ABNORMAL LOW (ref 26.0–34.0)
MCHC: 31.4 g/dL (ref 30.0–36.0)
MCV: 82.4 fL (ref 80.0–100.0)
Monocytes Absolute: 0.5 10*3/uL (ref 0.1–1.0)
Monocytes Relative: 8 %
Neutro Abs: 5.1 10*3/uL (ref 1.7–7.7)
Neutrophils Relative %: 70 %
Platelets: 229 10*3/uL (ref 150–400)
RBC: 5.84 MIL/uL — ABNORMAL HIGH (ref 4.22–5.81)
RDW: 13.2 % (ref 11.5–15.5)
WBC: 7.2 10*3/uL (ref 4.0–10.5)
nRBC: 0 % (ref 0.0–0.2)

## 2022-03-11 LAB — GROUP A STREP BY PCR: Group A Strep by PCR: NOT DETECTED

## 2022-03-11 LAB — RESP PANEL BY RT-PCR (FLU A&B, COVID) ARPGX2
Influenza A by PCR: NEGATIVE
Influenza B by PCR: NEGATIVE
SARS Coronavirus 2 by RT PCR: NEGATIVE

## 2022-03-11 LAB — BASIC METABOLIC PANEL
Anion gap: 7 (ref 5–15)
BUN: 20 mg/dL (ref 6–20)
CO2: 26 mmol/L (ref 22–32)
Calcium: 9.2 mg/dL (ref 8.9–10.3)
Chloride: 101 mmol/L (ref 98–111)
Creatinine, Ser: 1.24 mg/dL (ref 0.61–1.24)
GFR, Estimated: >60 mL/min (ref >60)
Glucose, Bld: 97 mg/dL (ref 70–99)
Potassium: 3.4 mmol/L — ABNORMAL LOW (ref 3.5–5.1)
Sodium: 134 mmol/L — ABNORMAL LOW (ref 135–145)

## 2022-03-11 MED ORDER — ONDANSETRON HCL 4 MG/2ML IJ SOLN
4.0000 mg | Freq: Once | INTRAMUSCULAR | Status: AC
  Administered 2022-03-11: 4 mg via INTRAVENOUS
  Filled 2022-03-11: qty 2

## 2022-03-11 MED ORDER — SODIUM CHLORIDE 0.9 % IV BOLUS
1000.0000 mL | Freq: Once | INTRAVENOUS | Status: AC
  Administered 2022-03-11: 1000 mL via INTRAVENOUS

## 2022-03-11 MED ORDER — ONDANSETRON HCL 8 MG PO TABS
8.0000 mg | ORAL_TABLET | Freq: Three times a day (TID) | ORAL | 0 refills | Status: AC | PRN
Start: 2022-03-11 — End: 2022-03-14

## 2022-03-11 NOTE — ED Notes (Signed)
Pt c/o nausea, vomiting, diarrhea, and chills that started yesterday evening. Pt denies fever.

## 2022-03-11 NOTE — ED Triage Notes (Signed)
Pt arrives with c/o n/v/d and chills. Pt denies fevers. Pt took zofran around 1pm.

## 2022-03-11 NOTE — Discharge Instructions (Addendum)
You labs, strep test, and covid/flu tests were negative here. Your symptoms may be due toa  viral stomach infection. Continue to hydrate to prevent dehydration. Follow-up with your provider for continued symptoms. Return if needed.

## 2022-03-11 NOTE — ED Provider Notes (Signed)
[facility] Emergency Department Provider Note     Event Date/Time   First MD Initiated Contact with Patient 03/11/22 2125     (approximate)   History   Nausea and Emesis   HPI  Thomas Padilla is a 31 y.o. male presents to the ED for evaluation of NVD and chills. He notes symptoms since this morning. He took an OTC generic form of an anti-emetic around 1 pm. He notes similar symptoms in his young daughter last week.   Physical Exam   Triage Vital Signs: ED Triage Vitals [03/11/22 2034]  Enc Vitals Group     BP (!) 109/92     Pulse Rate 76     Resp 16     Temp 99.5 F (37.5 C)     Temp src      SpO2 98 %     Weight      Height      Head Circumference      Peak Flow      Pain Score 0     Pain Loc      Pain Edu?      Excl. in GC?     Most recent vital signs: Vitals:   03/11/22 2034 03/11/22 2228  BP: (!) 109/92 117/67  Pulse: 76 65  Resp: 16 16  Temp: 99.5 F (37.5 C)   SpO2: 98% 99%    General Awake, no distress.  CV:  Good peripheral perfusion.  RESP:  Normal effort.  ABD:  No distention. Soft, nontender   ED Results / Procedures / Treatments   Labs (all labs ordered are listed, but only abnormal results are displayed) Labs Reviewed  CBC WITH DIFFERENTIAL/PLATELET - Abnormal; Notable for the following components:      Result Value   RBC 5.84 (*)    MCH 25.9 (*)    All other components within normal limits  BASIC METABOLIC PANEL - Abnormal; Notable for the following components:   Sodium 134 (*)    Potassium 3.4 (*)    All other components within normal limits  RESP PANEL BY RT-PCR (FLU A&B, COVID) ARPGX2  GROUP A STREP BY PCR     EKG   RADIOLOGY   No results found.   PROCEDURES:  Critical Care performed: No  Procedures   MEDICATIONS ORDERED IN ED: Medications  sodium chloride 0.9 % bolus 1,000 mL (0 mLs Intravenous Stopped 03/11/22 2331)  ondansetron (ZOFRAN) injection 4 mg (4 mg Intravenous Given  03/11/22 2226)     IMPRESSION / MDM / ASSESSMENT AND PLAN / ED COURSE  I reviewed the triage vital signs and the nursing notes.                              Differential diagnosis includes, but is not limited to, acute appendicitis, renal colic, testicular torsion, urinary tract infection/pyelonephritis, prostatitis,  epididymitis, diverticulitis, small bowel obstruction or ileus, colitis, abdominal aortic aneurysm, gastroenteritis, hernia, etc.   Patient with intermittent complaints of NVD since yesterday. His exam, labs, and overall work-up are reassuring. Patient's diagnosis is consistent with viral gastroenteritis. Patient will be discharged home with prescriptions for Zofran. Patient is to follow up with his PCP as needed or otherwise directed. Patient is given ED precautions to return to the ED for any worsening or new symptoms.   FINAL CLINICAL IMPRESSION(S) / ED DIAGNOSES   Final diagnoses:  Nausea vomiting and diarrhea  Gastroenteritis     Rx / DC Orders   ED Discharge Orders          Ordered    ondansetron (ZOFRAN) 8 MG tablet  Every 8 hours PRN        03/11/22 2330             Note:  This document was prepared using Dragon voice recognition software and may include unintentional dictation errors.    Lissa Hoard, PA-C 03/11/22 2333    Jene Every, MD 03/12/22 1257

## 2022-07-22 ENCOUNTER — Other Ambulatory Visit: Payer: Self-pay

## 2022-07-22 ENCOUNTER — Emergency Department
Admission: EM | Admit: 2022-07-22 | Discharge: 2022-07-22 | Disposition: A | Discharge status: Home / Self Care | Payer: BC Managed Care – PPO | Attending: Emergency Medicine | Admitting: Emergency Medicine

## 2022-07-22 DIAGNOSIS — B349 Viral infection, unspecified: Secondary | ICD-10-CM | POA: Diagnosis not present

## 2022-07-22 DIAGNOSIS — R519 Headache, unspecified: Secondary | ICD-10-CM | POA: Diagnosis present

## 2022-07-22 DIAGNOSIS — H1589 Other disorders of sclera: Secondary | ICD-10-CM | POA: Insufficient documentation

## 2022-07-22 DIAGNOSIS — H5789 Other specified disorders of eye and adnexa: Secondary | ICD-10-CM

## 2022-07-22 MED ORDER — SODIUM CHLORIDE 0.9 % IV BOLUS
1000.0000 mL | Freq: Once | INTRAVENOUS | Status: AC
  Administered 2022-07-22: 1000 mL via INTRAVENOUS

## 2022-07-22 MED ORDER — TETRACAINE HCL 0.5 % OP SOLN
1.0000 [drp] | Freq: Once | OPHTHALMIC | Status: AC
  Administered 2022-07-22: 1 [drp] via OPHTHALMIC

## 2022-07-22 MED ORDER — KETOROLAC TROMETHAMINE 15 MG/ML IJ SOLN
15.0000 mg | Freq: Once | INTRAMUSCULAR | Status: AC
  Administered 2022-07-22: 15 mg via INTRAVENOUS
  Filled 2022-07-22: qty 1

## 2022-07-22 MED ORDER — NAPROXEN 500 MG PO TABS: 500.0000 mg | ORAL_TABLET | Freq: Two times a day (BID) | ORAL | 2 refills | Status: AC

## 2022-07-22 MED ORDER — METOCLOPRAMIDE HCL 5 MG/ML IJ SOLN
10.0000 mg | Freq: Once | INTRAMUSCULAR | Status: AC
  Administered 2022-07-22: 10 mg via INTRAVENOUS
  Filled 2022-07-22: qty 2

## 2022-07-22 MED ORDER — FLUORESCEIN SODIUM 1 MG OP STRP
1.0000 | ORAL_STRIP | Freq: Once | OPHTHALMIC | Status: AC
  Administered 2022-07-22: 1 via OPHTHALMIC
  Filled 2022-07-22: qty 1

## 2022-07-22 NOTE — ED Notes (Signed)
See triage note. Presents with pain to left leg for about 1 week   States pain radiates from top of foot into posterior knee  Now also having headache to right side of head  pain is mainly behind eye  Denies any fever

## 2022-07-22 NOTE — ED Triage Notes (Signed)
Pt comes with c/o left leg pain that radiates to foot. Pt states this started week ago. Pt states it has continued. Pt also states right side face and headache that radiates to back of head.

## 2022-07-22 NOTE — Discharge Instructions (Signed)
If your headache worsens, or if you develop double vision, dizziness, fevers, neck stiffness, or any other new, worsening, or change in symptoms or other concerns, please return the emergency department immediately.  In the meantime, you may take the naproxen to help with your symptoms, but do not take with other NSAIDs including ibuprofen.  You may also take this in addition to Tylenol 650 mg every 6-8 hours.  It was a pleasure caring for you today.

## 2022-07-22 NOTE — ED Provider Notes (Signed)
[facility] Provider Note    Event Date/Time   First MD Initiated Contact with Patient 07/22/22 1359     (approximate)   History   Generalized Body Aches   HPI  Thomas Padilla is a 31 y.o. male who presents today for evaluation of right-sided headache and blurry vision.  He reports that his pain is mostly in his right eye, however his headache spreads across his forehead as well.  He reports that he has also had nasal congestion, runny nose, and generally feeling rundown over the last few days.  He took a COVID test yesterday which was negative.  He denies any known sick contacts.  He has not had any fevers or chills.  He has not had any neck pain or stiffness.  He denies wearing contact lenses.  There are no problems to display for this patient.         Physical Exam   Triage Vital Signs: ED Triage Vitals  Enc Vitals Group     BP 07/22/22 1124 127/88     Pulse Rate 07/22/22 1124 71     Resp 07/22/22 1124 18     Temp 07/22/22 1124 98.4 F (36.9 C)     Temp src --      SpO2 07/22/22 1124 98 %     Weight 07/22/22 1336 139 lb 15.9 oz (63.5 kg)     Height 07/22/22 1336 5\' 10"  (1.778 m)     Head Circumference --      Peak Flow --      Pain Score 07/22/22 1123 5     Pain Loc --      Pain Edu? --      Excl. in GC? --     Most recent vital signs: Vitals:   07/22/22 1515 07/22/22 1725  BP: 115/77 120/70  Pulse: 65 70  Resp:  18  Temp:    SpO2: 96% 98%    Physical Exam Vitals and nursing note reviewed.  Constitutional:      General: Awake and alert. No acute distress.    Appearance: Normal appearance. The patient is normal weight.  HENT:     Head: Normocephalic and atraumatic.     Mouth: Mucous membranes are moist.  Eyes:     General: PERRL. Normal EOMs        Conjunctiva/sclera: Injected sclera bilaterally.  No periorbital edema or erythema.  Normal extraocular movements.  No hyphema or hypopyon.  No proptosis noted.  Normal ocular  pressure of 14 .  No fluorescein uptake.  Mild tearing noted.  Normal lids and lashes.  Negative Seidel sign Cardiovascular:     Rate and Rhythm: Normal rate and regular rhythm.     Pulses: Normal pulses.     Heart sounds: Normal heart sounds Pulmonary:     Effort: Pulmonary effort is normal. No respiratory distress.     Breath sounds: Normal breath sounds.  Abdominal:     Abdomen is soft. There is no abdominal tenderness. No rebound or guarding. No distention. Musculoskeletal:        General: No swelling. Normal range of motion.     Cervical back: Normal range of motion and neck supple.  Skin:    General: Skin is warm and dry.     Capillary Refill: Capillary refill takes less than 2 seconds.     Findings: No rash.  Neurological:     Mental Status: The patient is awake and alert.  Neurological: GCS  15 alert and oriented x3 Normal speech, no expressive or receptive aphasia or dysarthria Cranial nerves II through XII intact Normal visual fields 5 out of 5 strength in all 4 extremities with intact sensation throughout No extremity drift Normal finger-to-nose testing, no limb or truncal ataxia     ED Results / Procedures / Treatments   Labs (all labs ordered are listed, but only abnormal results are displayed) Labs Reviewed - No data to display   EKG     RADIOLOGY     PROCEDURES:  Critical Care performed:   Procedures   MEDICATIONS ORDERED IN ED: Medications  tetracaine (PONTOCAINE) 0.5 % ophthalmic solution 1 drop (1 drop Right Eye Given 07/22/22 1558)  fluorescein ophthalmic strip 1 strip (1 strip Right Eye Given 07/22/22 1559)  ketorolac (TORADOL) 15 MG/ML injection 15 mg (15 mg Intravenous Given 07/22/22 1558)  sodium chloride 0.9 % bolus 1,000 mL (0 mLs Intravenous Stopped 07/22/22 1830)  metoCLOPramide (REGLAN) injection 10 mg (10 mg Intravenous Given 07/22/22 1558)     IMPRESSION / MDM / ASSESSMENT AND PLAN / ED COURSE  I reviewed the triage vital signs  and the nursing notes.   Differential diagnosis includes, but is not limited to, URI, acute angle-closure glaucoma, cluster headache, migraine, adenovirus, other viral syndrome.  Patient is awake and alert, hemodynamically stable and neurologically intact.  He has bilateral injected eyes, nasal congestion.  Ocular pressure of his right eye is normal at 14.  No fluorescein uptake.  Not consistent with corneal abrasion or keratitis.  Negative Seidel sign.  He reports that his kids are sick with similar symptoms.  He denies fevers.  He has no nuchal rigidity or fever to suggest meningitis.  No vertigo, visual changes, diplopia, neck pain to suggest a vertebral or carotid artery dissection.  Furthermore, no recent neck manipulation or trauma.  No periorbital edema or erythema to suggest preseptal or orbital cellulitis.  He declined advanced imaging.  Did not wish to have swab given that he took a negative COVID test yesterday.  He was treated symptomatically with a migraine cocktail with significant improvement in his symptoms.  We discussed return precautions and the importance of close outpatient follow-up.  Patient understands and agrees with plan.  He was discharged in stable condition with his significant other.   Patient's presentation is most consistent with acute complicated illness / injury requiring diagnostic workup.      FINAL CLINICAL IMPRESSION(S) / ED DIAGNOSES   Final diagnoses:  Acute nonintractable headache, unspecified headache type  Scleral injection  Viral syndrome     Rx / DC Orders   ED Discharge Orders          Ordered    naproxen (NAPROSYN) 500 MG tablet  2 times daily with meals        07/22/22 1807             Note:  This document was prepared using Dragon voice recognition software and may include unintentional dictation errors.   Emeline Gins 07/22/22 1847    Merlyn Lot, MD 07/22/22 2005

## 2022-09-16 ENCOUNTER — Ambulatory Visit
Admission: EM | Admit: 2022-09-16 | Discharge: 2022-09-16 | Disposition: A | Discharge status: Home / Self Care | Payer: BC Managed Care – PPO | Attending: Urgent Care | Admitting: Urgent Care

## 2022-09-16 DIAGNOSIS — Z20822 Contact with and (suspected) exposure to covid-19: Secondary | ICD-10-CM | POA: Diagnosis present

## 2022-09-16 DIAGNOSIS — U071 COVID-19: Secondary | ICD-10-CM | POA: Insufficient documentation

## 2022-09-16 LAB — RESP PANEL BY RT-PCR (FLU A&B, COVID) ARPGX2
Influenza A by PCR: NEGATIVE
Influenza B by PCR: NEGATIVE
SARS Coronavirus 2 by RT PCR: POSITIVE — AB

## 2022-09-16 NOTE — ED Triage Notes (Signed)
Pt. Presents to UC stating he tested positive for COVID yesterday after taking a home test. Pt. Is requesting COVID testing for work purposes.

## 2022-09-16 NOTE — Discharge Instructions (Signed)
Follow up here or with your primary care provider if your symptoms are worsening or not improving.     

## 2022-09-16 NOTE — ED Provider Notes (Signed)
Thomas Padilla    CSN: [id number] Arrival date & time: 09/16/22  1807      History   Chief Complaint Chief Complaint  Patient presents with   Covid Positive    HPI Thomas Padilla is a 31 y.o. male.   HPI  Presents to urgent care stating he tested positive for COVID yesterday after taking home test.  Is requesting lab testing for work purposes.  Symptoms include hot/cold, headache, scratchy throat. 1 episode of diarrhea though he doesn't know if he had food poisoning related to an undercook piece of meat.  Denies shortness of breath or dyspnea.  Denies nausea or vomiting.  No past medical history on file.  There are no problems to display for this patient.   No past surgical history on file.     Home Medications    Prior to Admission medications   Medication Sig Start Date End Date Taking? Authorizing Provider  naproxen (NAPROSYN) 500 MG tablet Take 1 tablet (500 mg total) by mouth 2 (two) times daily with a meal. 07/22/22   Poggi, Herb Grays, PA-C    Family History No family history on file.  Social History Social History   Tobacco Use   Smoking status: Never   Smokeless tobacco: Never  Substance Use Topics   Alcohol use: No   Drug use: No     Allergies   Patient has no known allergies.   Review of Systems Review of Systems   Physical Exam Triage Vital Signs ED Triage Vitals [09/16/22 1841]  Enc Vitals Group     BP 118/77     Pulse Rate 74     Resp 18     Temp 98.7 F (37.1 C)     Temp Source Oral     SpO2 98 %     Weight      Height      Head Circumference      Peak Flow      Pain Score      Pain Loc      Pain Edu?      Excl. in GC?    No data found.  Updated Vital Signs BP 118/77 (BP Location: Left Arm)   Pulse 74   Temp 98.7 F (37.1 C) (Oral)   Resp 18   SpO2 98%   Visual Acuity Right Eye Distance:   Left Eye Distance:   Bilateral Distance:    Right Eye Near:   Left Eye Near:    Bilateral Near:      Physical Exam Vitals reviewed.  Constitutional:      Appearance: Normal appearance. He is not ill-appearing.  Cardiovascular:     Rate and Rhythm: Normal rate and regular rhythm.     Pulses: Normal pulses.     Heart sounds: Normal heart sounds.  Pulmonary:     Effort: Pulmonary effort is normal.     Breath sounds: Normal breath sounds.  Skin:    General: Skin is warm and dry.  Neurological:     General: No focal deficit present.     Mental Status: He is alert and oriented to person, place, and time.  Psychiatric:        Mood and Affect: Mood normal.        Behavior: Behavior normal.      UC Treatments / Results  Labs (all labs ordered are listed, but only abnormal results are displayed) Labs Reviewed - No data to display  EKG  Radiology No results found.  Procedures Procedures (including critical care time)  Medications Ordered in UC Medications - No data to display  Initial Impression / Assessment and Plan / UC Course  I have reviewed the triage vital signs and the nursing notes.  Pertinent labs & imaging results that were available during my care of the patient were reviewed by me and considered in my medical decision making (see chart for details).   Mild symptoms of viral infection, likely COVID-19.  Results of respiratory swab will be available tomorrow.  Work note provided.   Final Clinical Impressions(s) / UC Diagnoses   Final diagnoses:  None   Discharge Instructions   None    ED Prescriptions   None    PDMP not reviewed this encounter.   Charma Igo, [place] 09/16/22 1850

## 2023-03-05 ENCOUNTER — Emergency Department
Admission: EM | Admit: 2023-03-05 | Discharge: 2023-03-05 | Disposition: A | Discharge status: Home / Self Care | Payer: 59 | Attending: Emergency Medicine | Admitting: Emergency Medicine

## 2023-03-05 ENCOUNTER — Emergency Department: Payer: 59

## 2023-03-05 DIAGNOSIS — W450XXA Nail entering through skin, initial encounter: Secondary | ICD-10-CM | POA: Diagnosis not present

## 2023-03-05 DIAGNOSIS — S59912A Unspecified injury of left forearm, initial encounter: Secondary | ICD-10-CM | POA: Diagnosis present

## 2023-03-05 DIAGNOSIS — S51812A Laceration without foreign body of left forearm, initial encounter: Secondary | ICD-10-CM | POA: Diagnosis not present

## 2023-03-05 DIAGNOSIS — Y990 Civilian activity done for income or pay: Secondary | ICD-10-CM | POA: Diagnosis not present

## 2023-03-05 DIAGNOSIS — Z23 Encounter for immunization: Secondary | ICD-10-CM | POA: Diagnosis not present

## 2023-03-05 MED ORDER — TETANUS-DIPHTH-ACELL PERTUSSIS 5-2.5-18.5 LF-MCG/0.5 IM SUSY
0.5000 mL | PREFILLED_SYRINGE | Freq: Once | INTRAMUSCULAR | Status: AC
  Administered 2023-03-05: 0.5 mL via INTRAMUSCULAR
  Filled 2023-03-05: qty 0.5

## 2023-03-05 MED ORDER — BACITRACIN ZINC 500 UNIT/GM EX OINT: TOPICAL_OINTMENT | Freq: Two times a day (BID) | CUTANEOUS | Status: DC

## 2023-03-05 MED ORDER — BACITRACIN ZINC 500 UNIT/GM EX OINT
TOPICAL_OINTMENT | CUTANEOUS | Status: AC
  Filled 2023-03-05: qty 0.9

## 2023-03-05 NOTE — ED Notes (Signed)
ED Provider at bedside. 

## 2023-03-05 NOTE — ED Provider Notes (Signed)
[facility] Provider Note    Event Date/Time   First MD Initiated Contact with Patient 03/05/23 0522     (approximate)   History   Arm Injury   HPI  Thomas Padilla is a 32 y.o. male who presents with a laceration to the left forearm.  Patient was at work when he slipped and fell onto a nail.  Sustained a laceration to the left forearm.  Unsure of last tetanus.  Denies numbness or tingling.     No past medical history on file.  There are no problems to display for this patient.    Physical Exam  Triage Vital Signs: ED Triage Vitals  Enc Vitals Group     BP 03/05/23 0440 133/68     Pulse Rate 03/05/23 0440 65     Resp 03/05/23 0440 18     Temp 03/05/23 0440 97.9 F (36.6 C)     Temp src --      SpO2 03/05/23 0440 99 %     Weight 03/05/23 0439 180 lb (81.6 kg)     Height 03/05/23 0439 5\' 10"  (1.778 m)     Head Circumference --      Peak Flow --      Pain Score --      Pain Loc --      Pain Edu? --      Excl. in GC? --     Most recent vital signs: Vitals:   03/05/23 0440  BP: 133/68  Pulse: 65  Resp: 18  Temp: 97.9 F (36.6 C)  SpO2: 99%     General: Awake, no distress.  CV:  Good peripheral perfusion.  Resp:  Normal effort.  Abd:  No distention.  Neuro:             Awake, Alert, Oriented x 3  Other:  Approximately 1 cm laceration that is gaping to the mid forearm Patient is able to fully flex and extend all digits on the left hand 2+ radial pulse   ED Results / Procedures / Treatments  Labs (all labs ordered are listed, but only abnormal results are displayed) Labs Reviewed - No data to display   EKG     RADIOLOGY I reviewed interpreted the x-ray of the left forearm which is negative for retained foreign body or fracture   PROCEDURES:  Critical Care performed: No  ..Laceration Repair  Date/Time: 03/05/2023 5:52 AM  Performed by: Georga Hacking, MD Authorized by: Georga Hacking, MD   Consent:     Consent obtained:  Verbal   Risks discussed:  Infection and pain Universal protocol:    Patient identity confirmed:  Verbally with patient Laceration details:    Location:  Shoulder/arm   Shoulder/arm location:  L lower arm   Length (cm):  1 Pre-procedure details:    Preparation:  Patient was prepped and draped in usual sterile fashion Exploration:    Limited defect created (wound extended): no     Wound extent: no foreign body, no signs of injury, no nerve damage, no tendon damage, no underlying fracture and no vascular damage   Treatment:    Area cleansed with:  Saline   Amount of cleaning:  Standard   Irrigation solution:  Sterile saline   Irrigation method:  Syringe   Visualized foreign bodies/material removed: no     Debridement:  None Skin repair:    Repair method:  Sutures   Suture size:  4-0   Suture  material:  Nylon   Suture technique:  Simple interrupted   Number of sutures:  2 Approximation:    Approximation:  Close Repair type:    Repair type:  Simple Post-procedure details:    Dressing:  Open (no dressing)   Procedure completion:  Tolerated   The patient is on the cardiac monitor to evaluate for evidence of arrhythmia and/or significant heart rate changes.   MEDICATIONS ORDERED IN ED: Medications  bacitracin ointment (has no administration in time range)  bacitracin 500 UNIT/GM ointment (has no administration in time range)  Tdap (BOOSTRIX) injection 0.5 mL (0.5 mLs Intramuscular Given 03/05/23 0542)     IMPRESSION / MDM / ASSESSMENT AND PLAN / ED COURSE  I reviewed the triage vital signs and the nursing notes.                              Patient's presentation is most consistent with acute, uncomplicated illness.  Differential diagnosis includes, but is not limited to, laceration, low suspicion for underlying fracture or retained foreign body  The patient is a 32 year old male who presents with a laceration to left forearm.  He slipped at work and  fell onto a nail.  He has a relatively small but gaping laceration of the left forearm.  Neurovascular intact.  No signs of extensor tendon injury.  An x-ray was obtained from triage which does not show any underlying fracture or retained foreign body.  Given the gaping nature of the forearm did not feel that glue would be the most appropriate so did closed with 2 nylon sutures.  Tetanus was updated.  Discussed return for suture removal in 7 to 10 days.  We discussed signs of infection.       FINAL CLINICAL IMPRESSION(S) / ED DIAGNOSES   Final diagnoses:  Laceration of left forearm, initial encounter     Rx / DC Orders   ED Discharge Orders     None        Note:  This document was prepared using Dragon voice recognition software and may include unintentional dictation errors.   Georga Hacking, MD 03/05/23 [phone]

## 2023-03-05 NOTE — ED Notes (Signed)
Laceration dressed with bactitracin and non stick sterile dressing.

## 2023-03-05 NOTE — ED Triage Notes (Signed)
Pt presents ambulatory to triage via POV with complaints of small laceration to the L forearm after falling on a nail at work tonight. Bleeding controlled with a rag the patient has wrapped on his arm. Not UTD on tetanus shot. A&Ox4 at this time. Denies hitting his head, LOC, CP or SOB.

## 2023-03-05 NOTE — ED Notes (Signed)
Pt states he is not sure if he needs a UDS for employer. Pt attempted to call "boss" without success. Not able to reach representative at [employer] where pt works to confirm if drug screen is needed.

## 2024-06-26 ENCOUNTER — Telehealth: Admitting: Nurse Practitioner

## 2024-06-26 ENCOUNTER — Encounter
